# Patient Record
Sex: Male | Born: 1960 | Race: White | Hispanic: No | Marital: Married | Smoking: Former smoker
Health system: Southern US, Community
[De-identification: ages and names within clinical notes are randomized; demographics above are authoritative.]

## PROBLEM LIST (undated history)

## (undated) DIAGNOSIS — M1990 Unspecified osteoarthritis, unspecified site: Secondary | ICD-10-CM

## (undated) DIAGNOSIS — M4802 Spinal stenosis, cervical region: Secondary | ICD-10-CM

## (undated) DIAGNOSIS — I219 Acute myocardial infarction, unspecified: Secondary | ICD-10-CM

## (undated) DIAGNOSIS — M722 Plantar fascial fibromatosis: Secondary | ICD-10-CM

## (undated) DIAGNOSIS — E039 Hypothyroidism, unspecified: Secondary | ICD-10-CM

## (undated) DIAGNOSIS — D332 Benign neoplasm of brain, unspecified: Secondary | ICD-10-CM

## (undated) DIAGNOSIS — F329 Major depressive disorder, single episode, unspecified: Secondary | ICD-10-CM

## (undated) DIAGNOSIS — T4145XA Adverse effect of unspecified anesthetic, initial encounter: Secondary | ICD-10-CM

## (undated) DIAGNOSIS — F32A Depression, unspecified: Secondary | ICD-10-CM

## (undated) DIAGNOSIS — I454 Nonspecific intraventricular block: Secondary | ICD-10-CM

## (undated) DIAGNOSIS — E785 Hyperlipidemia, unspecified: Secondary | ICD-10-CM

## (undated) DIAGNOSIS — I259 Chronic ischemic heart disease, unspecified: Secondary | ICD-10-CM

## (undated) DIAGNOSIS — J302 Other seasonal allergic rhinitis: Secondary | ICD-10-CM

## (undated) DIAGNOSIS — I10 Essential (primary) hypertension: Secondary | ICD-10-CM

## (undated) DIAGNOSIS — T8859XA Other complications of anesthesia, initial encounter: Secondary | ICD-10-CM

## (undated) DIAGNOSIS — K5909 Other constipation: Secondary | ICD-10-CM

## (undated) DIAGNOSIS — M719 Bursopathy, unspecified: Secondary | ICD-10-CM

## (undated) DIAGNOSIS — Z87442 Personal history of urinary calculi: Secondary | ICD-10-CM

## (undated) HISTORY — PX: OTHER SURGICAL HISTORY: SHX169

## (undated) HISTORY — PX: BRAIN SURGERY: SHX531

---

## 2009-09-15 HISTORY — PX: CARDIAC CATHETERIZATION: SHX172

## 2009-09-15 HISTORY — PX: OTHER SURGICAL HISTORY: SHX169

## 2014-05-16 ENCOUNTER — Other Ambulatory Visit: Payer: Self-pay | Admitting: Orthopedic Surgery

## 2014-05-16 NOTE — Progress Notes (Signed)
Preoperative surgical orders have been place into the Epic hospital system for Ermelinda Das on 05/16/2014, 10:48 AM  by Mickel Crow for surgery on 06/26/2014.  Preop Total Hip - Anterior Approach orders including Experel Injecion, IV Tylenol, and IV Decadron as long as there are no contraindications to the above medications. Arlee Muslim, PA-C

## 2014-06-13 ENCOUNTER — Other Ambulatory Visit (HOSPITAL_COMMUNITY): Payer: Self-pay | Admitting: Orthopedic Surgery

## 2014-06-13 ENCOUNTER — Encounter (HOSPITAL_COMMUNITY): Payer: Self-pay

## 2014-06-13 NOTE — Progress Notes (Signed)
lov dr [facility] cardiology 10-16-13 on chart Medical clearance note dr Iona Hansen on chart for 06-26-14 surgery ekg 10-16-13 dr Barnabas Lister cardiology on chart Nuclear stress test 11-16-13 dr Sylvie Farrier on chart

## 2014-06-13 NOTE — Patient Instructions (Signed)
Jimmy Kelley  06/13/2014   Your procedure is scheduled on: Wednesday September 16th, 2015  Report to [facility] Main Entrance and follow signs to  [place] at 1100 AM.  Call this number if you have problems the morning of surgery [phone]   Remember:  Do not eat food :After Midnight.   clear liquids midnight unitl 700 am day of surgery, nothing by mouth after 700 am day of surgery.   Take these medicines the morning of surgery with A SIP OF WATER:                                You may not have any metal on your body including hair pins and piercings  Do not wear jewelry, make-up, lotions, powders, or deodorant.   Men may shave face and neck.  Do not bring valuables to the hospital. [place].  Contacts, dentures or bridgework may not be worn into surgery.  Leave suitcase in the car. After surgery it may be brought to your room.  For patients admitted to the hospital, checkout time is 11:00 AM the day of discharge.   Patients discharged the day of surgery will not be allowed to drive home.  Name and phone number of your driver:  Special Instructions: N/A ________________________________________________________________________  [facility] - Preparing for Surgery Before surgery, you can play an important role.  Because skin is not sterile, your skin needs to be as free of germs as possible.  You can reduce the number of germs on your skin by washing with CHG (chlorahexidine gluconate) soap before surgery.  CHG is an antiseptic cleaner which kills germs and bonds with the skin to continue killing germs even after washing. Please DO NOT use if you have an allergy to CHG or antibacterial soaps.  If your skin becomes reddened/irritated stop using the CHG and inform your nurse when you arrive at Short Stay. Do not shave (including legs and underarms) for at least 48 hours prior to the first CHG shower.  You may shave your  face/neck. Please follow these instructions carefully:  1.  Shower with CHG Soap the night before surgery and the  morning of Surgery.  2.  If you choose to wash your hair, wash your hair first as usual with your  normal  shampoo.  3.  After you shampoo, rinse your hair and body thoroughly to remove the  shampoo.                           4.  Use CHG as you would any other liquid soap.  You can apply chg directly  to the skin and wash                       Gently with a scrungie or clean washcloth.  5.  Apply the CHG Soap to your body ONLY FROM THE NECK DOWN.   Do not use on face/ open                           Wound or open sores. Avoid contact with eyes, ears mouth and genitals (private parts).                       Wash face,  Genitals (  private parts) with your normal soap.             6.  Wash thoroughly, paying special attention to the area where your surgery  will be performed.  7.  Thoroughly rinse your body with warm water from the neck down.  8.  DO NOT shower/wash with your normal soap after using and rinsing off  the CHG Soap.                9.  Pat yourself dry with a clean towel.            10.  Wear clean pajamas.            11.  Place clean sheets on your bed the night of your first shower and do not  sleep with pets. Day of Surgery : Do not apply any lotions/deodorants the morning of surgery.  Please wear clean clothes to the hospital/surgery center.  FAILURE TO FOLLOW THESE INSTRUCTIONS MAY RESULT IN THE CANCELLATION OF YOUR SURGERY PATIENT SIGNATURE_________________________________  NURSE SIGNATURE__________________________________  ________________________________________________________________________    CLEAR LIQUID DIET   Foods Allowed                                                                     Foods Excluded  Coffee and tea, regular and decaf                             liquids that you cannot  Plain Jell-O in any flavor                                              see through such as: Fruit ices (not with fruit pulp)                                     milk, soups, orange juice  Iced Popsicles                                    All solid food Carbonated beverages, regular and diet                                    Cranberry, grape and apple juices Sports drinks like Gatorade Lightly seasoned clear broth or consume(fat free) Sugar, honey syrup  Sample Menu Breakfast                                Lunch                                     Supper Cranberry juice                    Beef broth  Chicken broth Jell-O                                     Grape juice                           Apple juice Coffee or tea                        Jell-O                                      Popsicle                                                Coffee or tea                        Coffee or tea  _____________________________________________________________________    Incentive Spirometer  An incentive spirometer is a tool that can help keep your lungs clear and active. This tool measures how well you are filling your lungs with each breath. Taking long deep breaths may help reverse or decrease the chance of developing breathing (pulmonary) problems (especially infection) following:  A long period of time when you are unable to move or be active. BEFORE THE PROCEDURE   If the spirometer includes an indicator to show your best effort, your nurse or respiratory therapist will set it to a desired goal.  If possible, sit up straight or lean slightly forward. Try not to slouch.  Hold the incentive spirometer in an upright position. INSTRUCTIONS FOR USE  1. Sit on the edge of your bed if possible, or sit up as far as you can in bed or on a chair. 2. Hold the incentive spirometer in an upright position. 3. Breathe out normally. 4. Place the mouthpiece in your mouth and seal your lips tightly around it. 5. Breathe in slowly  and as deeply as possible, raising the piston or the ball toward the top of the column. 6. Hold your breath for 3-5 seconds or for as long as possible. Allow the piston or ball to fall to the bottom of the column. 7. Remove the mouthpiece from your mouth and breathe out normally. 8. Rest for a few seconds and repeat Steps 1 through 7 at least 10 times every 1-2 hours when you are awake. Take your time and take a few normal breaths between deep breaths. 9. The spirometer may include an indicator to show your best effort. Use the indicator as a goal to work toward during each repetition. 10. After each set of 10 deep breaths, practice coughing to be sure your lungs are clear. If you have an incision (the cut made at the time of surgery), support your incision when coughing by placing a pillow or rolled up towels firmly against it. Once you are able to get out of bed, walk around indoors and cough well. You may stop using the incentive spirometer when instructed by your caregiver.  RISKS AND COMPLICATIONS  Take your time so you do not get dizzy or light-headed.  If you are in pain, you  may need to take or ask for pain medication before doing incentive spirometry. It is harder to take a deep breath if you are having pain. AFTER USE  Rest and breathe slowly and easily.  It can be helpful to keep track of a log of your progress. Your caregiver can provide you with a simple table to help with this. If you are using the spirometer at home, follow these instructions: [place] IF:   You are having difficultly using the spirometer.  You have trouble using the spirometer as often as instructed.  Your pain medication is not giving enough relief while using the spirometer.  You develop fever of 100.5 F (38.1 C) or higher. SEEK IMMEDIATE MEDICAL CARE IF:   You cough up bloody sputum that had not been present before.  You develop fever of 102 F (38.9 C) or greater.  You develop  worsening pain at or near the incision site. MAKE SURE YOU:   Understand these instructions.  Will watch your condition.  Will get help right away if you are not doing well or get worse. Document Released: 02/07/2007 Document Revised: 12/20/2011 Document Reviewed: 04/10/2007 ExitCare Patient Information 2014 ExitCare, [place].   ________________________________________________________________________  WHAT IS A BLOOD TRANSFUSION? Blood Transfusion Information  A transfusion is the replacement of blood or some of its parts. Blood is made up of multiple cells which provide different functions.  Red blood cells carry oxygen and are used for blood loss replacement.  White blood cells fight against infection.  Platelets control bleeding.  Plasma helps clot blood.  Other blood products are available for specialized needs, such as hemophilia or other clotting disorders. BEFORE THE TRANSFUSION  Who gives blood for transfusions?   Healthy volunteers who are fully evaluated to make sure their blood is safe. This is blood bank blood. Transfusion therapy is the safest it has ever been in the practice of medicine. Before blood is taken from a donor, a complete history is taken to make sure that person has no history of diseases nor engages in risky social behavior (examples are intravenous drug use or sexual activity with multiple partners). The donor's travel history is screened to minimize risk of transmitting infections, such as malaria. The donated blood is tested for signs of infectious diseases, such as HIV and hepatitis. The blood is then tested to be sure it is compatible with you in order to minimize the chance of a transfusion reaction. If you or a relative donates blood, this is often done in anticipation of surgery and is not appropriate for emergency situations. It takes many days to process the donated blood. RISKS AND COMPLICATIONS Although transfusion therapy is very safe and saves  many lives, the main dangers of transfusion include:   Getting an infectious disease.  Developing a transfusion reaction. This is an allergic reaction to something in the blood you were given. Every precaution is taken to prevent this. The decision to have a blood transfusion has been considered carefully by your caregiver before blood is given. Blood is not given unless the benefits outweigh the risks. AFTER THE TRANSFUSION  Right after receiving a blood transfusion, you will usually feel much better and more energetic. This is especially true if your red blood cells have gotten low (anemic). The transfusion raises the level of the red blood cells which carry oxygen, and this usually causes an energy increase.  The nurse administering the transfusion will monitor you carefully for complications. HOME CARE INSTRUCTIONS  No  special instructions are needed after a transfusion. You may find your energy is better. Speak with your caregiver about any limitations on activity for underlying diseases you may have. SEEK MEDICAL CARE IF:   Your condition is not improving after your transfusion.  You develop redness or irritation at the intravenous (IV) site. SEEK IMMEDIATE MEDICAL CARE IF:  Any of the following symptoms occur over the next 12 hours:  Shaking chills.  You have a temperature by mouth above 102 F (38.9 C), not controlled by medicine.  Chest, back, or muscle pain.  People around you feel you are not acting correctly or are confused.  Shortness of breath or difficulty breathing.  Dizziness and fainting.  You get a rash or develop hives.  You have a decrease in urine output.  Your urine turns a dark color or changes to pink, red, or brown. Any of the following symptoms occur over the next 10 days:  You have a temperature by mouth above 102 F (38.9 C), not controlled by medicine.  Shortness of breath.  Weakness after normal activity.  The white part of the eye turns  yellow (jaundice).  You have a decrease in the amount of urine or are urinating less often.  Your urine turns a dark color or changes to pink, red, or brown. Document Released: 09/24/2000 Document Revised: 12/20/2011 Document Reviewed: 05/13/2008 [facility] Patient Information 2014 [place], [place].  _______________________________________________________________________

## 2014-06-14 ENCOUNTER — Inpatient Hospital Stay (HOSPITAL_COMMUNITY)
Admission: RE | Admit: 2014-06-14 | Discharge: 2014-06-14 | Disposition: A | Discharge status: Home / Self Care | Payer: PRIVATE HEALTH INSURANCE | Source: Ambulatory Visit

## 2014-06-14 HISTORY — DX: Nonspecific intraventricular block: I45.4

## 2014-06-14 HISTORY — DX: Personal history of urinary calculi: Z87.442

## 2014-06-14 HISTORY — DX: Essential (primary) hypertension: I10

## 2014-06-14 HISTORY — DX: Depression, unspecified: F32.A

## 2014-06-14 HISTORY — DX: Spinal stenosis, cervical region: M48.02

## 2014-06-14 HISTORY — DX: Major depressive disorder, single episode, unspecified: F32.9

## 2014-06-14 HISTORY — DX: Unspecified osteoarthritis, unspecified site: M19.90

## 2014-06-14 HISTORY — DX: Hypothyroidism, unspecified: E03.9

## 2014-06-14 HISTORY — DX: Chronic ischemic heart disease, unspecified: I25.9

## 2014-06-14 HISTORY — DX: Bursopathy, unspecified: M71.9

## 2014-06-14 HISTORY — DX: Plantar fascial fibromatosis: M72.2

## 2014-06-14 HISTORY — DX: Other constipation: K59.09

## 2014-06-14 HISTORY — DX: Hyperlipidemia, unspecified: E78.5

## 2014-06-18 ENCOUNTER — Encounter (HOSPITAL_COMMUNITY): Payer: Self-pay

## 2014-06-18 ENCOUNTER — Ambulatory Visit (HOSPITAL_COMMUNITY)
Admission: RE | Admit: 2014-06-18 | Discharge: 2014-06-18 | Disposition: A | Discharge status: Home / Self Care | Payer: PRIVATE HEALTH INSURANCE | Source: Ambulatory Visit | Attending: Anesthesiology | Admitting: Anesthesiology

## 2014-06-18 ENCOUNTER — Encounter (HOSPITAL_COMMUNITY): Payer: Self-pay | Admitting: Pharmacy Technician

## 2014-06-18 ENCOUNTER — Other Ambulatory Visit: Payer: Self-pay | Admitting: Orthopedic Surgery

## 2014-06-18 ENCOUNTER — Encounter (HOSPITAL_COMMUNITY)
Admission: RE | Admit: 2014-06-18 | Discharge: 2014-06-18 | Disposition: A | Discharge status: Home / Self Care | Payer: PRIVATE HEALTH INSURANCE | Source: Ambulatory Visit | Attending: Orthopedic Surgery | Admitting: Orthopedic Surgery

## 2014-06-18 DIAGNOSIS — Z96649 Presence of unspecified artificial hip joint: Secondary | ICD-10-CM | POA: Insufficient documentation

## 2014-06-18 DIAGNOSIS — M87059 Idiopathic aseptic necrosis of unspecified femur: Secondary | ICD-10-CM | POA: Insufficient documentation

## 2014-06-18 DIAGNOSIS — M169 Osteoarthritis of hip, unspecified: Secondary | ICD-10-CM | POA: Diagnosis not present

## 2014-06-18 DIAGNOSIS — Z01818 Encounter for other preprocedural examination: Secondary | ICD-10-CM | POA: Insufficient documentation

## 2014-06-18 DIAGNOSIS — Z87891 Personal history of nicotine dependence: Secondary | ICD-10-CM | POA: Insufficient documentation

## 2014-06-18 DIAGNOSIS — M1610 Unilateral primary osteoarthritis, unspecified hip: Secondary | ICD-10-CM | POA: Insufficient documentation

## 2014-06-18 HISTORY — DX: Adverse effect of unspecified anesthetic, initial encounter: T41.45XA

## 2014-06-18 HISTORY — DX: Other complications of anesthesia, initial encounter: T88.59XA

## 2014-06-18 HISTORY — DX: Benign neoplasm of brain, unspecified: D33.2

## 2014-06-18 HISTORY — DX: Other seasonal allergic rhinitis: J30.2

## 2014-06-18 HISTORY — DX: Acute myocardial infarction, unspecified: I21.9

## 2014-06-18 LAB — URINE MICROSCOPIC-ADD ON

## 2014-06-18 LAB — PROTIME-INR
INR: 1.02 (ref 0.00–1.49)
Prothrombin Time: 13.4 seconds (ref 11.6–15.2)

## 2014-06-18 LAB — URINALYSIS, ROUTINE W REFLEX MICROSCOPIC
Glucose, UA: NEGATIVE mg/dL
Ketones, ur: NEGATIVE mg/dL
LEUKOCYTES UA: NEGATIVE
Nitrite: NEGATIVE
Protein, ur: NEGATIVE mg/dL
SPECIFIC GRAVITY, URINE: 1.031 — AB (ref 1.005–1.030)
UROBILINOGEN UA: 0.2 mg/dL (ref 0.0–1.0)
pH: 5.5 (ref 5.0–8.0)

## 2014-06-18 LAB — COMPREHENSIVE METABOLIC PANEL
ALBUMIN: 4.1 g/dL (ref 3.5–5.2)
ALK PHOS: 75 U/L (ref 39–117)
ALT: 40 U/L (ref 0–53)
AST: 39 U/L — ABNORMAL HIGH (ref 0–37)
Anion gap: 12 (ref 5–15)
BILIRUBIN TOTAL: 1 mg/dL (ref 0.3–1.2)
BUN: 19 mg/dL (ref 6–23)
CHLORIDE: 97 meq/L (ref 96–112)
CO2: 29 mEq/L (ref 19–32)
Calcium: 9.9 mg/dL (ref 8.4–10.5)
Creatinine, Ser: 1.41 mg/dL — ABNORMAL HIGH (ref 0.50–1.35)
GFR calc Af Amer: 64 mL/min — ABNORMAL LOW (ref >90)
GFR calc non Af Amer: 55 mL/min — ABNORMAL LOW (ref >90)
GLUCOSE: 97 mg/dL (ref 70–99)
POTASSIUM: 4 meq/L (ref 3.7–5.3)
Sodium: 138 mEq/L (ref 137–147)
Total Protein: 7.9 g/dL (ref 6.0–8.3)

## 2014-06-18 LAB — CBC
HCT: 40.4 % (ref 39.0–52.0)
HEMOGLOBIN: 14 g/dL (ref 13.0–17.0)
MCH: 30.8 pg (ref 26.0–34.0)
MCHC: 34.7 g/dL (ref 30.0–36.0)
MCV: 89 fL (ref 78.0–100.0)
Platelets: 280 10*3/uL (ref 150–400)
RBC: 4.54 MIL/uL (ref 4.22–5.81)
RDW: 12.7 % (ref 11.5–15.5)
WBC: 10.1 10*3/uL (ref 4.0–10.5)

## 2014-06-18 LAB — SURGICAL PCR SCREEN
MRSA, PCR: NEGATIVE
STAPHYLOCOCCUS AUREUS: NEGATIVE

## 2014-06-18 LAB — ABO/RH: ABO/RH(D): A POS

## 2014-06-18 LAB — APTT: APTT: 30 s (ref 24–37)

## 2014-06-18 NOTE — Patient Instructions (Signed)
[place]  06/18/2014   Your procedure is scheduled on: 06/26/2014  Report to [facility] Main Entrance and follow signs to  [place] at 10:00 AM.  Call this number if you have problems the morning of surgery [phone]   Remember:  Do not eat food or drink liquids :After Midnight.     Take these medicines the morning of surgery with A SIP OF WATER: Celexa,Levothyroxine and pain med as needed.                               You may not have any metal on your body including hair pins and piercings  Do not wear jewelry, lotions, powders, or deodorant.   Men may shave face and neck.  Do not bring valuables to the hospital. [place].  Contacts, dentures or bridgework may not be worn into surgery.  Leave suitcase in the car. After surgery it may be brought to your room.  For patients admitted to the hospital, checkout time is 11:00 AM the day of discharge.   Name and phone number of your driver:  Special Instructions: N/A ________________________________________________________________________  [facility] - Preparing for Surgery Before surgery, you can play an important role.  Because skin is not sterile, your skin needs to be as free of germs as possible.  You can reduce the number of germs on your skin by washing with CHG (chlorahexidine gluconate) soap before surgery.  CHG is an antiseptic cleaner which kills germs and bonds with the skin to continue killing germs even after washing. Please DO NOT use if you have an allergy to CHG or antibacterial soaps.  If your skin becomes reddened/irritated stop using the CHG and inform your nurse when you arrive at Short Stay. Do not shave (including legs and underarms) for at least 48 hours prior to the first CHG shower.  You may shave your face/neck. Please follow these instructions carefully:  1.  Shower with CHG Soap the night before surgery and the  morning of Surgery.  2.  If you  choose to wash your hair, wash your hair first as usual with your  normal  shampoo.  3.  After you shampoo, rinse your hair and body thoroughly to remove the  shampoo.                           4.  Use CHG as you would any other liquid soap.  You can apply chg directly  to the skin and wash                       Gently with a scrungie or clean washcloth.  5.  Apply the CHG Soap to your body ONLY FROM THE NECK DOWN.   Do not use on face/ open                           Wound or open sores. Avoid contact with eyes, ears mouth and genitals (private parts).                       Wash face,  Genitals (private parts) with your normal soap.             6.  Wash thoroughly, paying special attention to the area  where your surgery  will be performed.  7.  Thoroughly rinse your body with warm water from the neck down.  8.  DO NOT shower/wash with your normal soap after using and rinsing off  the CHG Soap.                9.  Pat yourself dry with a clean towel.            10.  Wear clean pajamas.            11.  Place clean sheets on your bed the night of your first shower and do not  sleep with pets. Day of Surgery : Do not apply any lotions/deodorants the morning of surgery.  Please wear clean clothes to the hospital/surgery center.  FAILURE TO FOLLOW THESE INSTRUCTIONS MAY RESULT IN THE CANCELLATION OF YOUR SURGERY PATIENT SIGNATURE_________________________________  NURSE SIGNATURE__________________________________  ________________________________________________________________________   Jimmy Kelley  An incentive spirometer is a tool that can help keep your lungs clear and active. This tool measures how well you are filling your lungs with each breath. Taking long deep breaths may help reverse or decrease the chance of developing breathing (pulmonary) problems (especially infection) following:  A long period of time when you are unable to move or be active. BEFORE THE PROCEDURE   If  the spirometer includes an indicator to show your best effort, your nurse or respiratory therapist will set it to a desired goal.  If possible, sit up straight or lean slightly forward. Try not to slouch.  Hold the incentive spirometer in an upright position. INSTRUCTIONS FOR USE  1. Sit on the edge of your bed if possible, or sit up as far as you can in bed or on a chair. 2. Hold the incentive spirometer in an upright position. 3. Breathe out normally. 4. Place the mouthpiece in your mouth and seal your lips tightly around it. 5. Breathe in slowly and as deeply as possible, raising the piston or the ball toward the top of the column. 6. Hold your breath for 3-5 seconds or for as long as possible. Allow the piston or ball to fall to the bottom of the column. 7. Remove the mouthpiece from your mouth and breathe out normally. 8. Rest for a few seconds and repeat Steps 1 through 7 at least 10 times every 1-2 hours when you are awake. Take your time and take a few normal breaths between deep breaths. 9. The spirometer may include an indicator to show your best effort. Use the indicator as a goal to work toward during each repetition. 10. After each set of 10 deep breaths, practice coughing to be sure your lungs are clear. If you have an incision (the cut made at the time of surgery), support your incision when coughing by placing a pillow or rolled up towels firmly against it. Once you are able to get out of bed, walk around indoors and cough well. You may stop using the incentive spirometer when instructed by your caregiver.  RISKS AND COMPLICATIONS  Take your time so you do not get dizzy or light-headed.  If you are in pain, you may need to take or ask for pain medication before doing incentive spirometry. It is harder to take a deep breath if you are having pain. AFTER USE  Rest and breathe slowly and easily.  It can be helpful to keep track of a log of your progress. Your caregiver can  provide you with a simple  table to help with this. If you are using the spirometer at home, follow these instructions: [place] IF:   You are having difficultly using the spirometer.  You have trouble using the spirometer as often as instructed.  Your pain medication is not giving enough relief while using the spirometer.  You develop fever of 100.5 F (38.1 C) or higher. SEEK IMMEDIATE MEDICAL CARE IF:   You cough up bloody sputum that had not been present before.  You develop fever of 102 F (38.9 C) or greater.  You develop worsening pain at or near the incision site. MAKE SURE YOU:   Understand these instructions.  Will watch your condition.  Will get help right away if you are not doing well or get worse. Document Released: 02/07/2007 Document Revised: 12/20/2011 Document Reviewed: 04/10/2007 ExitCare Patient Information 2014 ExitCare, [place].   ________________________________________________________________________  WHAT IS A BLOOD TRANSFUSION? Blood Transfusion Information  A transfusion is the replacement of blood or some of its parts. Blood is made up of multiple cells which provide different functions.  Red blood cells carry oxygen and are used for blood loss replacement.  White blood cells fight against infection.  Platelets control bleeding.  Plasma helps clot blood.  Other blood products are available for specialized needs, such as hemophilia or other clotting disorders. BEFORE THE TRANSFUSION  Who gives blood for transfusions?   Healthy volunteers who are fully evaluated to make sure their blood is safe. This is blood bank blood. Transfusion therapy is the safest it has ever been in the practice of medicine. Before blood is taken from a donor, a complete history is taken to make sure that person has no history of diseases nor engages in risky social behavior (examples are intravenous drug use or sexual activity with multiple partners). The  donor's travel history is screened to minimize risk of transmitting infections, such as malaria. The donated blood is tested for signs of infectious diseases, such as HIV and hepatitis. The blood is then tested to be sure it is compatible with you in order to minimize the chance of a transfusion reaction. If you or a relative donates blood, this is often done in anticipation of surgery and is not appropriate for emergency situations. It takes many days to process the donated blood. RISKS AND COMPLICATIONS Although transfusion therapy is very safe and saves many lives, the main dangers of transfusion include:   Getting an infectious disease.  Developing a transfusion reaction. This is an allergic reaction to something in the blood you were given. Every precaution is taken to prevent this. The decision to have a blood transfusion has been considered carefully by your caregiver before blood is given. Blood is not given unless the benefits outweigh the risks. AFTER THE TRANSFUSION  Right after receiving a blood transfusion, you will usually feel much better and more energetic. This is especially true if your red blood cells have gotten low (anemic). The transfusion raises the level of the red blood cells which carry oxygen, and this usually causes an energy increase.  The nurse administering the transfusion will monitor you carefully for complications. HOME CARE INSTRUCTIONS  No special instructions are needed after a transfusion. You may find your energy is better. Speak with your caregiver about any limitations on activity for underlying diseases you may have. SEEK MEDICAL CARE IF:   Your condition is not improving after your transfusion.  You develop redness or irritation at the intravenous (IV) site. [place]  IF:  Any of the following symptoms occur over the next 12 hours:  Shaking chills.  You have a temperature by mouth above 102 F (38.9 C), not controlled by  medicine.  Chest, back, or muscle pain.  People around you feel you are not acting correctly or are confused.  Shortness of breath or difficulty breathing.  Dizziness and fainting.  You get a rash or develop hives.  You have a decrease in urine output.  Your urine turns a dark color or changes to pink, red, or brown. Any of the following symptoms occur over the next 10 days:  You have a temperature by mouth above 102 F (38.9 C), not controlled by medicine.  Shortness of breath.  Weakness after normal activity.  The white part of the eye turns yellow (jaundice).  You have a decrease in the amount of urine or are urinating less often.  Your urine turns a dark color or changes to pink, red, or brown. Document Released: 09/24/2000 Document Revised: 12/20/2011 Document Reviewed: 05/13/2008 [facility] Patient Information 2014 [place], [place].  _______________________________________________________________________

## 2014-06-18 NOTE — Progress Notes (Signed)
Stop bang tool results per PAT visit 06/18/2014 sent to Dr Vista Lawman per [facility]

## 2014-06-18 NOTE — Pre-Procedure Instructions (Addendum)
EKG 10/2013;stress test 11/2013;LOV note [facility],clearance note 10/16/2013 Dr McGukin;clearance note Dr Vista Lawman 03/19/2014 reports with chart;chest x ray done per PAT visit 06/18/2014

## 2014-06-18 NOTE — Pre-Procedure Instructions (Signed)
Labs visible in epic note urinalysis

## 2014-06-19 ENCOUNTER — Other Ambulatory Visit: Payer: Self-pay | Admitting: Surgical

## 2014-06-19 MED ORDER — TRANEXAMIC ACID 100 MG/ML IV SOLN: 2000.0000 mg | Freq: Once | INTRAVENOUS | Status: AC

## 2014-06-19 NOTE — H&P (Signed)
TOTAL HIP ADMISSION H&P  Patient is admitted for left total hip arthroplasty.  Subjective:  Chief Complaint: left hip pain  HPI: Jimmy Kelley, 53 y.o. male, has a history of pain and functional disability in the left hip(s) due to arthritis and AVN and patient has failed non-surgical conservative treatments for greater than 12 weeks to include NSAID's and/or analgesics, corticosteriod injections, flexibility and strengthening excercises, weight reduction as appropriate and activity modification.  Onset of symptoms was gradual starting 2 years ago with gradually worsening course since that time.The patient noted no past surgery on the left hip(s).  Patient currently rates pain in the left hip at 7 out of 10 with activity. Patient has night pain, worsening of pain with activity and weight bearing, pain that interfers with activities of daily living, pain with passive range of motion and crepitus. Patient has evidence of subchondral sclerosis, periarticular osteophytes and joint space narrowing by imaging studies. This condition presents safety issues increasing the risk of falls. This patient has had avascular necrosis of the hip.  There is no current active infection.   Past Medical History  Diagnosis Date  . Hypertension   . History of kidney stones   . Cervical spinal stenosis   . Bursitis     heel  . Chronic constipation   . Hypothyroidism   . Hyperlipidemia   . Intraventricular conduction defect   . Depression   . Chronic ischemic heart disease   . Arthritis     oa  . Plantar fasciitis   . Complication of anesthesia     pt states he wakes up slowly   . Myocardial infarction     MI 09/15/2009 stent placement   . Seasonal allergies   . Brain tumor (benign)     craniotomy    Past Surgical History  Procedure Laterality Date  . Stent to heart  09-15-2009    to LAD  . Cardiac catheterization  09-15-2009    times one stent  . Brain surgery      age 50  . Ganglion cyst left wrist     . Thumb surgery       local left thumb      Current outpatient prescriptions: acetaminophen (TYLENOL) 325 MG tablet, Take 650 mg by mouth every 6 (six) hours as needed for moderate pain or headache., Disp: , Rfl: ;   aspirin EC 81 MG tablet, Take 81 mg by mouth every morning., Disp: , Rfl: ;   atorvastatin (LIPITOR) 80 MG tablet, Take 80 mg by mouth at bedtime., Disp: , Rfl: ;   citalopram (CELEXA) 40 MG tablet, Take 40 mg by mouth every morning. Pt states takes at night, Disp: , Rfl:  clopidogrel (PLAVIX) 75 MG tablet, Take 75 mg by mouth at bedtime. , Disp: , Rfl: ;   docusate sodium (COLACE) 100 MG capsule, Take 200 mg by mouth daily. Takes as needed, Disp: , Rfl: ;   latanoprost (XALATAN) 0.005 % ophthalmic solution, Place 1 drop into both eyes at bedtime., Disp: , Rfl: ;  levocetirizine (XYZAL) 5 MG tablet, Take 5 mg by mouth every evening. Has not taken in past 3 months, Disp: , Rfl:  levothyroxine (SYNTHROID, LEVOTHROID) 200 MCG tablet, Take 200 mcg by mouth daily before breakfast., Disp: , Rfl: ;   Multiple Vitamin (MULTIVITAMIN WITH MINERALS) TABS tablet, Take 1 tablet by mouth every evening., Disp: , Rfl: ;   traMADol (ULTRAM) 50 MG tablet, Take 50 mg by mouth 4 (four) times  daily as needed for moderate pain., Disp: , Rfl:   No Known Allergies  History  Substance Use Topics  . Smoking status: Former Smoker    Quit date: 09/15/2009  . Smokeless tobacco: Never Used  . Alcohol Use: No    Family History Cancer. Father. Cerebrovascular Accident. Father. Depression. Mother, Sister. Diabetes Mellitus. Paternal Grandmother. Hypertension. Father, Mother.  Review of Systems  Constitutional: Negative.   HENT: Negative.   Eyes: Negative.   Respiratory: Negative.   Cardiovascular: Negative.   Gastrointestinal: Positive for constipation. Negative for heartburn, nausea, vomiting, abdominal pain, diarrhea, blood in stool and melena.  Genitourinary: Negative.    Musculoskeletal: Positive for back pain and joint pain. Negative for falls, myalgias and neck pain.       Bilateral hip pain  Skin: Negative.   Neurological: Negative.   Endo/Heme/Allergies: Negative.   Psychiatric/Behavioral: Negative.     Objective:  Physical Exam  Constitutional: He is oriented to person, place, and time. He appears well-developed. No distress.  Obese  HENT:  Head: Normocephalic and atraumatic.  Right Ear: External ear normal.  Left Ear: External ear normal.  Nose: Nose normal.  Mouth/Throat: Oropharynx is clear and moist.  Eyes: Conjunctivae and EOM are normal.  Neck: Normal range of motion. Neck supple.  Cardiovascular: Normal rate, regular rhythm, normal heart sounds and intact distal pulses.   No murmur heard. Respiratory: Effort normal and breath sounds normal. No respiratory distress. He has no wheezes.  GI: Soft. Bowel sounds are normal. He exhibits no distension. There is no tenderness.  Musculoskeletal:       Right hip: Normal.       Left hip: He exhibits decreased range of motion and decreased strength.       Right knee: Normal.       Left knee: Normal.       Right lower leg: He exhibits no tenderness and no swelling.       Left lower leg: He exhibits no tenderness and no swelling.  Evaluation of his left hip flexion to 110, rotation in 20, out 30, and abduction 40 with some pain on range of motion. Right hip flexion to 120, rotation in 30, out 40, and abduction 40 without discomfort. Knee exam is normal.  Neurological: He is alert and oriented to person, place, and time. He has normal strength and normal reflexes. No sensory deficit.  Skin: No rash noted. He is not diaphoretic. No erythema.  Psychiatric: He has a normal mood and affect. His behavior is normal.    Vital signs in last 24 hours: Temp:  [98.4 F (36.9 C)] 98.4 F (36.9 C) (09/08 1428) Pulse Rate:  [86] 86 (09/08 1428) Resp:  [18] 18 (09/08 1428) BP: (107)/(72) 107/72 mmHg  (09/08 1428) SpO2:  [98 %] 98 % (09/08 1428) Weight:  [116.756 kg (257 lb 6.4 oz)] 116.756 kg (257 lb 6.4 oz) (09/08 1428)    Imaging Review Plain radiographs demonstrate severe degenerative joint disease of the left hip(s). The bone quality appears to be good for age and reported activity level.  Assessment/Plan:  End stage arthritis, left hip(s)  The patient history, physical examination, clinical judgement of the provider and imaging studies are consistent with end stage degenerative joint disease of the left hip(s) and total hip arthroplasty is deemed medically necessary. The treatment options including medical management, injection therapy, arthroscopy and arthroplasty were discussed at length. The risks and benefits of total hip arthroplasty were presented and reviewed. The risks due  to aseptic loosening, infection, stiffness, dislocation/subluxation,  thromboembolic complications and other imponderables were discussed.  The patient acknowledged the explanation, agreed to proceed with the plan and consent was signed. Patient is being admitted for inpatient treatment for surgery, pain control, PT, OT, prophylactic antibiotics, VTE prophylaxis, progressive ambulation and ADL's and discharge planning.The patient is planning to be discharged home with home health services   Topical TXA  Cardio: Dr. Beatrix Fetters PCP: Dr. Katrine Coho, PA-C

## 2014-06-26 ENCOUNTER — Encounter (HOSPITAL_COMMUNITY): Payer: Self-pay | Admitting: *Deleted

## 2014-06-26 ENCOUNTER — Encounter (HOSPITAL_COMMUNITY)
Admission: RE | Disposition: A | Discharge status: Home Health | Payer: Self-pay | Source: Ambulatory Visit | Attending: Orthopedic Surgery

## 2014-06-26 ENCOUNTER — Inpatient Hospital Stay (HOSPITAL_COMMUNITY)
Admission: RE | Admit: 2014-06-26 | Discharge: 2014-06-27 | DRG: 470 | Disposition: A | Discharge status: Home Health | Payer: PRIVATE HEALTH INSURANCE | Source: Ambulatory Visit | Attending: Orthopedic Surgery | Admitting: Orthopedic Surgery

## 2014-06-26 ENCOUNTER — Inpatient Hospital Stay (HOSPITAL_COMMUNITY): Payer: PRIVATE HEALTH INSURANCE

## 2014-06-26 ENCOUNTER — Encounter (HOSPITAL_COMMUNITY): Payer: PRIVATE HEALTH INSURANCE | Admitting: Anesthesiology

## 2014-06-26 ENCOUNTER — Inpatient Hospital Stay (HOSPITAL_COMMUNITY): Payer: PRIVATE HEALTH INSURANCE | Admitting: Anesthesiology

## 2014-06-26 DIAGNOSIS — Z6832 Body mass index (BMI) 32.0-32.9, adult: Secondary | ICD-10-CM

## 2014-06-26 DIAGNOSIS — M25559 Pain in unspecified hip: Secondary | ICD-10-CM | POA: Diagnosis present

## 2014-06-26 DIAGNOSIS — M87059 Idiopathic aseptic necrosis of unspecified femur: Secondary | ICD-10-CM | POA: Diagnosis present

## 2014-06-26 DIAGNOSIS — I252 Old myocardial infarction: Secondary | ICD-10-CM

## 2014-06-26 DIAGNOSIS — Z79899 Other long term (current) drug therapy: Secondary | ICD-10-CM | POA: Diagnosis not present

## 2014-06-26 DIAGNOSIS — E785 Hyperlipidemia, unspecified: Secondary | ICD-10-CM | POA: Diagnosis present

## 2014-06-26 DIAGNOSIS — E039 Hypothyroidism, unspecified: Secondary | ICD-10-CM | POA: Diagnosis present

## 2014-06-26 DIAGNOSIS — Z7982 Long term (current) use of aspirin: Secondary | ICD-10-CM

## 2014-06-26 DIAGNOSIS — Z9861 Coronary angioplasty status: Secondary | ICD-10-CM | POA: Diagnosis not present

## 2014-06-26 DIAGNOSIS — Z87891 Personal history of nicotine dependence: Secondary | ICD-10-CM | POA: Diagnosis not present

## 2014-06-26 DIAGNOSIS — M1612 Unilateral primary osteoarthritis, left hip: Secondary | ICD-10-CM

## 2014-06-26 DIAGNOSIS — M169 Osteoarthritis of hip, unspecified: Secondary | ICD-10-CM | POA: Diagnosis present

## 2014-06-26 DIAGNOSIS — M1610 Unilateral primary osteoarthritis, unspecified hip: Secondary | ICD-10-CM | POA: Diagnosis present

## 2014-06-26 DIAGNOSIS — Z87442 Personal history of urinary calculi: Secondary | ICD-10-CM

## 2014-06-26 DIAGNOSIS — I10 Essential (primary) hypertension: Secondary | ICD-10-CM | POA: Diagnosis present

## 2014-06-26 HISTORY — PX: TOTAL HIP ARTHROPLASTY: SHX124

## 2014-06-26 LAB — TYPE AND SCREEN
ABO/RH(D): A POS
ANTIBODY SCREEN: NEGATIVE

## 2014-06-26 SURGERY — ARTHROPLASTY, HIP, TOTAL, ANTERIOR APPROACH
Anesthesia: General | Site: Hip | Laterality: Left

## 2014-06-26 MED ORDER — DEXTROSE-NACL 5-0.9 % IV SOLN
INTRAVENOUS | Status: DC
  Administered 2014-06-26: 100 mL/h via INTRAVENOUS

## 2014-06-26 MED ORDER — MIDAZOLAM HCL 5 MG/5ML IJ SOLN
INTRAMUSCULAR | Status: DC | PRN
  Administered 2014-06-26: 2 mg via INTRAVENOUS

## 2014-06-26 MED ORDER — BUPIVACAINE LIPOSOME 1.3 % IJ SUSP
INTRAMUSCULAR | Status: DC | PRN
  Administered 2014-06-26: 20 mL

## 2014-06-26 MED ORDER — KETOROLAC TROMETHAMINE 15 MG/ML IJ SOLN
7.5000 mg | Freq: Four times a day (QID) | INTRAMUSCULAR | Status: AC | PRN
  Administered 2014-06-26 (×2): 7.5 mg via INTRAVENOUS
  Filled 2014-06-26: qty 1

## 2014-06-26 MED ORDER — 0.9 % SODIUM CHLORIDE (POUR BTL) OPTIME
TOPICAL | Status: DC | PRN
  Administered 2014-06-26: 1000 mL

## 2014-06-26 MED ORDER — PROPOFOL 10 MG/ML IV BOLUS
INTRAVENOUS | Status: DC | PRN
Start: 2014-06-26 — End: 2014-06-26
  Administered 2014-06-26: 200 mg via INTRAVENOUS

## 2014-06-26 MED ORDER — ONDANSETRON HCL 4 MG/2ML IJ SOLN
4.0000 mg | Freq: Four times a day (QID) | INTRAMUSCULAR | Status: DC | PRN
Start: 2014-06-26 — End: 2014-06-27

## 2014-06-26 MED ORDER — METOCLOPRAMIDE HCL 5 MG/ML IJ SOLN: 5.0000 mg | Freq: Three times a day (TID) | INTRAMUSCULAR | Status: DC | PRN

## 2014-06-26 MED ORDER — NEOSTIGMINE METHYLSULFATE 10 MG/10ML IV SOLN
INTRAVENOUS | Status: DC | PRN
  Administered 2014-06-26: 3 mg via INTRAVENOUS

## 2014-06-26 MED ORDER — DEXAMETHASONE SODIUM PHOSPHATE 10 MG/ML IJ SOLN
10.0000 mg | Freq: Once | INTRAMUSCULAR | Status: AC
  Administered 2014-06-26: 10 mg via INTRAVENOUS

## 2014-06-26 MED ORDER — ACETAMINOPHEN 650 MG RE SUPP: 650.0000 mg | Freq: Four times a day (QID) | RECTAL | Status: DC | PRN

## 2014-06-26 MED ORDER — DOCUSATE SODIUM 100 MG PO CAPS
100.0000 mg | ORAL_CAPSULE | Freq: Two times a day (BID) | ORAL | Status: DC
  Administered 2014-06-26 – 2014-06-27 (×2): 100 mg via ORAL

## 2014-06-26 MED ORDER — MENTHOL 3 MG MT LOZG: 1.0000 | LOZENGE | OROMUCOSAL | Status: DC | PRN

## 2014-06-26 MED ORDER — DEXAMETHASONE 6 MG PO TABS
10.0000 mg | ORAL_TABLET | Freq: Every day | ORAL | Status: AC
  Administered 2014-06-27: 10 mg via ORAL
  Filled 2014-06-26: qty 1

## 2014-06-26 MED ORDER — OXYCODONE HCL 5 MG PO TABS
5.0000 mg | ORAL_TABLET | ORAL | Status: DC | PRN
  Administered 2014-06-26 – 2014-06-27 (×6): 10 mg via ORAL
  Filled 2014-06-26 (×6): qty 2

## 2014-06-26 MED ORDER — MORPHINE SULFATE 2 MG/ML IJ SOLN: 1.0000 mg | INTRAMUSCULAR | Status: DC | PRN

## 2014-06-26 MED ORDER — POLYETHYLENE GLYCOL 3350 17 G PO PACK: 17.0000 g | PACK | Freq: Every day | ORAL | Status: DC | PRN

## 2014-06-26 MED ORDER — HYDROMORPHONE HCL 2 MG/ML IJ SOLN
INTRAMUSCULAR | Status: AC
  Filled 2014-06-26: qty 1

## 2014-06-26 MED ORDER — HYDROMORPHONE HCL 1 MG/ML IJ SOLN
0.2500 mg | INTRAMUSCULAR | Status: DC | PRN
  Administered 2014-06-26 (×2): 0.5 mg via INTRAVENOUS

## 2014-06-26 MED ORDER — ROCURONIUM BROMIDE 100 MG/10ML IV SOLN
INTRAVENOUS | Status: DC | PRN
  Administered 2014-06-26: 10 mg via INTRAVENOUS
  Administered 2014-06-26: 50 mg via INTRAVENOUS

## 2014-06-26 MED ORDER — BISACODYL 10 MG RE SUPP: 10.0000 mg | Freq: Every day | RECTAL | Status: DC | PRN

## 2014-06-26 MED ORDER — PHENOL 1.4 % MT LIQD: 1.0000 | OROMUCOSAL | Status: DC | PRN

## 2014-06-26 MED ORDER — BUPIVACAINE-EPINEPHRINE (PF) 0.25% -1:200000 IJ SOLN
INTRAMUSCULAR | Status: AC
  Filled 2014-06-26: qty 30

## 2014-06-26 MED ORDER — ACETAMINOPHEN 500 MG PO TABS
1000.0000 mg | ORAL_TABLET | Freq: Four times a day (QID) | ORAL | Status: AC
  Administered 2014-06-26 – 2014-06-27 (×2): 1000 mg via ORAL
  Administered 2014-06-27: 500 mg via ORAL
  Administered 2014-06-27: 1000 mg via ORAL
  Filled 2014-06-26 (×4): qty 2

## 2014-06-26 MED ORDER — GLYCOPYRROLATE 0.2 MG/ML IJ SOLN
INTRAMUSCULAR | Status: DC | PRN
  Administered 2014-06-26: 0.4 mg via INTRAVENOUS

## 2014-06-26 MED ORDER — CHLORHEXIDINE GLUCONATE 4 % EX LIQD: 60.0000 mL | Freq: Once | CUTANEOUS | Status: DC

## 2014-06-26 MED ORDER — ROCURONIUM BROMIDE 100 MG/10ML IV SOLN
INTRAVENOUS | Status: AC
  Filled 2014-06-26: qty 1

## 2014-06-26 MED ORDER — ACETAMINOPHEN 325 MG PO TABS: 650.0000 mg | ORAL_TABLET | Freq: Four times a day (QID) | ORAL | Status: DC | PRN

## 2014-06-26 MED ORDER — KETOROLAC TROMETHAMINE 15 MG/ML IJ SOLN
INTRAMUSCULAR | Status: AC
  Filled 2014-06-26: qty 1

## 2014-06-26 MED ORDER — MIDAZOLAM HCL 2 MG/2ML IJ SOLN
INTRAMUSCULAR | Status: AC
  Filled 2014-06-26: qty 2

## 2014-06-26 MED ORDER — CEFAZOLIN SODIUM-DEXTROSE 2-3 GM-% IV SOLR
2.0000 g | Freq: Four times a day (QID) | INTRAVENOUS | Status: AC
  Administered 2014-06-26 – 2014-06-27 (×2): 2 g via INTRAVENOUS
  Filled 2014-06-26 (×2): qty 50

## 2014-06-26 MED ORDER — LIDOCAINE HCL (CARDIAC) 20 MG/ML IV SOLN
INTRAVENOUS | Status: DC | PRN
  Administered 2014-06-26: 100 mg via INTRAVENOUS

## 2014-06-26 MED ORDER — BUPIVACAINE LIPOSOME 1.3 % IJ SUSP
20.0000 mL | Freq: Once | INTRAMUSCULAR | Status: DC
  Filled 2014-06-26: qty 20

## 2014-06-26 MED ORDER — LIDOCAINE HCL (CARDIAC) 20 MG/ML IV SOLN
INTRAVENOUS | Status: AC
  Filled 2014-06-26: qty 5

## 2014-06-26 MED ORDER — ATORVASTATIN CALCIUM 80 MG PO TABS
80.0000 mg | ORAL_TABLET | Freq: Every day | ORAL | Status: DC
  Administered 2014-06-26: 80 mg via ORAL
  Filled 2014-06-26 (×2): qty 1

## 2014-06-26 MED ORDER — ONDANSETRON HCL 4 MG/2ML IJ SOLN
INTRAMUSCULAR | Status: DC | PRN
  Administered 2014-06-26: 4 mg via INTRAVENOUS

## 2014-06-26 MED ORDER — LEVOTHYROXINE SODIUM 200 MCG PO TABS
200.0000 ug | ORAL_TABLET | Freq: Every day | ORAL | Status: DC
  Administered 2014-06-27: 200 ug via ORAL
  Filled 2014-06-26 (×2): qty 1

## 2014-06-26 MED ORDER — METHOCARBAMOL 500 MG PO TABS
500.0000 mg | ORAL_TABLET | Freq: Four times a day (QID) | ORAL | Status: DC | PRN
  Administered 2014-06-26 – 2014-06-27 (×3): 500 mg via ORAL
  Filled 2014-06-26 (×3): qty 1

## 2014-06-26 MED ORDER — SODIUM CHLORIDE 0.9 % IV SOLN: INTRAVENOUS | Status: DC

## 2014-06-26 MED ORDER — CITALOPRAM HYDROBROMIDE 40 MG PO TABS
40.0000 mg | ORAL_TABLET | Freq: Every morning | ORAL | Status: DC
  Administered 2014-06-27: 40 mg via ORAL
  Filled 2014-06-26: qty 1

## 2014-06-26 MED ORDER — GLYCOPYRROLATE 0.2 MG/ML IJ SOLN
INTRAMUSCULAR | Status: AC
  Filled 2014-06-26: qty 3

## 2014-06-26 MED ORDER — DEXAMETHASONE SODIUM PHOSPHATE 10 MG/ML IJ SOLN
10.0000 mg | Freq: Every day | INTRAMUSCULAR | Status: AC
  Filled 2014-06-26: qty 1

## 2014-06-26 MED ORDER — CEFAZOLIN SODIUM-DEXTROSE 2-3 GM-% IV SOLR
2.0000 g | INTRAVENOUS | Status: AC
  Administered 2014-06-26: 2 g via INTRAVENOUS

## 2014-06-26 MED ORDER — HYDROMORPHONE HCL 1 MG/ML IJ SOLN
INTRAMUSCULAR | Status: AC
  Filled 2014-06-26: qty 1

## 2014-06-26 MED ORDER — FLEET ENEMA 7-19 GM/118ML RE ENEM: 1.0000 | ENEMA | Freq: Once | RECTAL | Status: AC | PRN

## 2014-06-26 MED ORDER — METOCLOPRAMIDE HCL 10 MG PO TABS: 5.0000 mg | ORAL_TABLET | Freq: Three times a day (TID) | ORAL | Status: DC | PRN

## 2014-06-26 MED ORDER — DEXAMETHASONE SODIUM PHOSPHATE 10 MG/ML IJ SOLN
INTRAMUSCULAR | Status: AC
  Filled 2014-06-26: qty 1

## 2014-06-26 MED ORDER — HYDROMORPHONE HCL 1 MG/ML IJ SOLN
INTRAMUSCULAR | Status: DC | PRN
  Administered 2014-06-26 (×2): 1 mg via INTRAVENOUS
  Administered 2014-06-26: 0.5 mg via INTRAVENOUS
  Administered 2014-06-26: 1 mg via INTRAVENOUS
  Administered 2014-06-26: 0.5 mg via INTRAVENOUS

## 2014-06-26 MED ORDER — RIVAROXABAN 10 MG PO TABS
10.0000 mg | ORAL_TABLET | Freq: Every day | ORAL | Status: DC
  Administered 2014-06-27: 10 mg via ORAL
  Filled 2014-06-26 (×2): qty 1

## 2014-06-26 MED ORDER — LACTATED RINGERS IV SOLN: INTRAVENOUS | Status: DC

## 2014-06-26 MED ORDER — LEVOCETIRIZINE DIHYDROCHLORIDE 5 MG PO TABS: 5.0000 mg | ORAL_TABLET | Freq: Every evening | ORAL | Status: DC

## 2014-06-26 MED ORDER — NEOSTIGMINE METHYLSULFATE 10 MG/10ML IV SOLN
INTRAVENOUS | Status: AC
  Filled 2014-06-26: qty 1

## 2014-06-26 MED ORDER — ROCURONIUM BROMIDE 100 MG/10ML IV SOLN
INTRAVENOUS | Status: AC
Start: 2014-06-26 — End: 2014-06-26
  Filled 2014-06-26: qty 1

## 2014-06-26 MED ORDER — CEFAZOLIN SODIUM-DEXTROSE 2-3 GM-% IV SOLR
INTRAVENOUS | Status: AC
  Filled 2014-06-26: qty 50

## 2014-06-26 MED ORDER — BUPIVACAINE HCL (PF) 0.25 % IJ SOLN
INTRAMUSCULAR | Status: DC | PRN
  Administered 2014-06-26: 30 mL

## 2014-06-26 MED ORDER — ONDANSETRON HCL 4 MG PO TABS: 4.0000 mg | ORAL_TABLET | Freq: Four times a day (QID) | ORAL | Status: DC | PRN

## 2014-06-26 MED ORDER — FENTANYL CITRATE 0.05 MG/ML IJ SOLN
INTRAMUSCULAR | Status: DC | PRN
  Administered 2014-06-26 (×2): 100 ug via INTRAVENOUS

## 2014-06-26 MED ORDER — TRANEXAMIC ACID 100 MG/ML IV SOLN
2000.0000 mg | Freq: Once | INTRAVENOUS | Status: DC
  Filled 2014-06-26: qty 20

## 2014-06-26 MED ORDER — LACTATED RINGERS IV SOLN
INTRAVENOUS | Status: DC | PRN
  Administered 2014-06-26 (×2): via INTRAVENOUS

## 2014-06-26 MED ORDER — LATANOPROST 0.005 % OP SOLN
1.0000 [drp] | Freq: Every day | OPHTHALMIC | Status: DC
  Filled 2014-06-26: qty 2.5

## 2014-06-26 MED ORDER — PROMETHAZINE HCL 25 MG/ML IJ SOLN: 6.2500 mg | INTRAMUSCULAR | Status: DC | PRN

## 2014-06-26 MED ORDER — METHOCARBAMOL 1000 MG/10ML IJ SOLN
500.0000 mg | Freq: Four times a day (QID) | INTRAMUSCULAR | Status: DC | PRN
  Administered 2014-06-26: 500 mg via INTRAVENOUS
  Filled 2014-06-26: qty 5

## 2014-06-26 MED ORDER — LACTATED RINGERS IV SOLN
INTRAVENOUS | Status: DC | PRN
  Administered 2014-06-26: 12:00:00 via INTRAVENOUS

## 2014-06-26 MED ORDER — LORATADINE 10 MG PO TABS
10.0000 mg | ORAL_TABLET | Freq: Every day | ORAL | Status: DC
  Filled 2014-06-26 (×2): qty 1

## 2014-06-26 MED ORDER — TRANEXAMIC ACID 100 MG/ML IV SOLN
2000.0000 mg | INTRAVENOUS | Status: DC | PRN
  Administered 2014-06-26: 2000 mg via TOPICAL

## 2014-06-26 MED ORDER — DIPHENHYDRAMINE HCL 12.5 MG/5ML PO ELIX: 12.5000 mg | ORAL_SOLUTION | ORAL | Status: DC | PRN

## 2014-06-26 MED ORDER — FENTANYL CITRATE 0.05 MG/ML IJ SOLN
INTRAMUSCULAR | Status: AC
  Filled 2014-06-26: qty 5

## 2014-06-26 MED ORDER — SODIUM CHLORIDE 0.9 % IJ SOLN
INTRAMUSCULAR | Status: DC | PRN
  Administered 2014-06-26: 30 mL via INTRAVENOUS

## 2014-06-26 MED ORDER — ACETAMINOPHEN 10 MG/ML IV SOLN
1000.0000 mg | Freq: Once | INTRAVENOUS | Status: AC
  Administered 2014-06-26: 1000 mg via INTRAVENOUS
  Filled 2014-06-26: qty 100

## 2014-06-26 MED ORDER — ONDANSETRON HCL 4 MG/2ML IJ SOLN
INTRAMUSCULAR | Status: AC
  Filled 2014-06-26: qty 2

## 2014-06-26 MED ORDER — SODIUM CHLORIDE 0.9 % IJ SOLN
INTRAMUSCULAR | Status: AC
  Filled 2014-06-26: qty 50

## 2014-06-26 SURGICAL SUPPLY — 40 items
BAG ZIPLOCK 12X15 (MISCELLANEOUS) IMPLANT
BLADE EXTENDED COATED 6.5IN (ELECTRODE) ×3 IMPLANT
BLADE SAG 18X100X1.27 (BLADE) ×3 IMPLANT
CAPT HIP PF COP ×3 IMPLANT
CLOSURE WOUND 1/2 X4 (GAUZE/BANDAGES/DRESSINGS) ×1
COVER PERINEAL POST (MISCELLANEOUS) ×3 IMPLANT
DECANTER SPIKE VIAL GLASS SM (MISCELLANEOUS) ×3 IMPLANT
DRAPE C-ARM 42X120 X-RAY (DRAPES) ×3 IMPLANT
DRAPE STERI IOBAN 125X83 (DRAPES) ×3 IMPLANT
DRAPE U-SHAPE 47X51 STRL (DRAPES) ×9 IMPLANT
DRSG ADAPTIC 3X8 NADH LF (GAUZE/BANDAGES/DRESSINGS) ×3 IMPLANT
DRSG MEPILEX BORDER 4X4 (GAUZE/BANDAGES/DRESSINGS) ×3 IMPLANT
DRSG MEPILEX BORDER 4X8 (GAUZE/BANDAGES/DRESSINGS) ×3 IMPLANT
DURAPREP 26ML APPLICATOR (WOUND CARE) ×3 IMPLANT
ELECT REM PT RETURN 9FT ADLT (ELECTROSURGICAL) ×3
ELECTRODE REM PT RTRN 9FT ADLT (ELECTROSURGICAL) ×1 IMPLANT
EVACUATOR 1/8 PVC DRAIN (DRAIN) ×3 IMPLANT
FACESHIELD WRAPAROUND (MASK) ×12 IMPLANT
GAUZE SPONGE 4X4 12PLY STRL (GAUZE/BANDAGES/DRESSINGS) IMPLANT
GLOVE BIO SURGEON STRL SZ7.5 (GLOVE) ×3 IMPLANT
GLOVE BIO SURGEON STRL SZ8 (GLOVE) ×6 IMPLANT
GLOVE BIOGEL PI IND STRL 8 (GLOVE) ×2 IMPLANT
GLOVE BIOGEL PI INDICATOR 8 (GLOVE) ×4
GOWN STRL REUS W/TWL LRG LVL3 (GOWN DISPOSABLE) ×3 IMPLANT
GOWN STRL REUS W/TWL XL LVL3 (GOWN DISPOSABLE) ×3 IMPLANT
KIT BASIN OR (CUSTOM PROCEDURE TRAY) ×3 IMPLANT
NDL SAFETY ECLIPSE 18X1.5 (NEEDLE) ×2 IMPLANT
NEEDLE HYPO 18GX1.5 SHARP (NEEDLE) ×4
PACK TOTAL JOINT (CUSTOM PROCEDURE TRAY) ×3 IMPLANT
STRIP CLOSURE SKIN 1/2X4 (GAUZE/BANDAGES/DRESSINGS) ×2 IMPLANT
SUT ETHIBOND NAB CT1 #1 30IN (SUTURE) ×3 IMPLANT
SUT MNCRL AB 4-0 PS2 18 (SUTURE) ×3 IMPLANT
SUT VIC AB 2-0 CT1 27 (SUTURE) ×4
SUT VIC AB 2-0 CT1 TAPERPNT 27 (SUTURE) ×2 IMPLANT
SUT VLOC 180 0 24IN GS25 (SUTURE) ×3 IMPLANT
SYR 20CC LL (SYRINGE) ×3 IMPLANT
SYR 50ML LL SCALE MARK (SYRINGE) ×3 IMPLANT
TOWEL OR 17X26 10 PK STRL BLUE (TOWEL DISPOSABLE) ×3 IMPLANT
TRAY FOLEY CATH 14FRSI W/METER (CATHETERS) ×3 IMPLANT
TRAY FOLEY METER SIL LF 16FR (CATHETERS) ×3 IMPLANT

## 2014-06-26 NOTE — Interval H&P Note (Signed)
History and Physical Interval Note:  06/26/2014 12:20 PM  Jimmy Kelley  has presented today for surgery, with the diagnosis of avascular necrosis of the left hip  The various methods of treatment have been discussed with the patient and family. After consideration of risks, benefits and other options for treatment, the patient has consented to  Procedure(s): LEFT TOTAL HIP ARTHROPLASTY ANTERIOR APPROACH (Left) as a surgical intervention .  The patient's history has been reviewed, patient examined, no change in status, stable for surgery.  I have reviewed the patient's chart and labs.  Questions were answered to the patient's satisfaction.     Gearlean Alf

## 2014-06-26 NOTE — Anesthesia Preprocedure Evaluation (Addendum)
Anesthesia Evaluation  Patient identified by MRN, date of birth, ID band Patient awake    Reviewed: Allergy & Precautions, H&P , NPO status , Patient's Chart, lab work & pertinent test results  History of Anesthesia Complications (+) PROLONGED EMERGENCE and history of anesthetic complications  Airway Mallampati: II TM Distance: >3 FB Neck ROM: Full    Dental no notable dental hx.    Pulmonary former smoker,  breath sounds clear to auscultation  Pulmonary exam normal       Cardiovascular hypertension, + Past MI and + Cardiac Stents + dysrhythmias Rhythm:Regular Rate:Normal  MI 09-15-09, stent to LAD.   Neuro/Psych PSYCHIATRIC DISORDERS Depression negative neurological ROS     GI/Hepatic negative GI ROS, Neg liver ROS,   Endo/Other  Hypothyroidism   Renal/GU negative Renal ROS  negative genitourinary   Musculoskeletal  (+) Arthritis -,   Abdominal (+) + obese,   Peds negative pediatric ROS (+)  Hematology negative hematology ROS (+)   Anesthesia Other Findings   Reproductive/Obstetrics negative OB ROS                         Anesthesia Physical Anesthesia Plan  ASA: III  Anesthesia Plan: General   Post-op Pain Management:    Induction: Intravenous  Airway Management Planned: Oral ETT  Additional Equipment:   Intra-op Plan:   Post-operative Plan: Extubation in OR  Informed Consent: I have reviewed the patients History and Physical, chart, labs and discussed the procedure including the risks, benefits and alternatives for the proposed anesthesia with the patient or authorized representative who has indicated his/her understanding and acceptance.   Dental advisory given  Plan Discussed with: CRNA  Anesthesia Plan Comments: (Discussed general and spinal. Last plavix 06-22-14. Plan general.)       Anesthesia Quick Evaluation

## 2014-06-26 NOTE — Transfer of Care (Signed)
Immediate Anesthesia Transfer of Care Note  Patient: Jimmy Kelley  Procedure(s) Performed: Procedure(s): LEFT TOTAL HIP ARTHROPLASTY ANTERIOR APPROACH (Left)  Patient Location: PACU  Anesthesia Type:General  Level of Consciousness: awake, alert  and oriented  Airway & Oxygen Therapy: Patient Spontanous Breathing and Patient connected to face mask oxygen  Post-op Assessment: Report given to PACU RN and Post -op Vital signs reviewed and stable  Post vital signs: Reviewed and stable  Complications: No apparent anesthesia complications

## 2014-06-26 NOTE — Op Note (Signed)
OPERATIVE REPORT  PREOPERATIVE DIAGNOSIS: Osteoarthritis of the Left hip.   POSTOPERATIVE DIAGNOSIS: Osteoarthritis of the Left  hip.   PROCEDURE: Left total hip arthroplasty, anterior approach.   SURGEON: Gaynelle Arabian, MD   ASSISTANT: Arlee Muslim, PA-C  ANESTHESIA:  General  ESTIMATED BLOOD LOSS:- 650 ml  DRAINS: Hemovac x1.   COMPLICATIONS: None   CONDITION: PACU - hemodynamically stable.   BRIEF CLINICAL NOTE: Jimmy Kelley is a 53 y.o. male who has advanced end-  stage arthritis of his Left  hip with progressively worsening pain and  dysfunction.The patient has failed nonoperative management and presents for  total hip arthroplasty.   PROCEDURE IN DETAIL: After successful administration of spinal  anesthetic, the traction boots for the [facility] bed were placed on both  feet and the patient was placed onto the [facility] bed, boots placed into the leg  holders. The Left hip was then isolated from the perineum with plastic  drapes and prepped and draped in the usual sterile fashion. ASIS and  greater trochanter were marked and a oblique incision was made, starting  at about 1 cm lateral and 2 cm distal to the ASIS and coursing towards  the anterior cortex of the femur. The skin was cut with a 10 blade  through subcutaneous tissue to the level of the fascia overlying the  tensor fascia lata muscle. The fascia was then incised in line with the  incision at the junction of the anterior third and posterior 2/3rd. The  muscle was teased off the fascia and then the interval between the TFL  and the rectus was developed. The Hohmann retractor was then placed at  the top of the femoral neck over the capsule. The vessels overlying the  capsule were cauterized and the fat on top of the capsule was removed.  A Hohmann retractor was then placed anterior underneath the rectus  femoris to give exposure to the entire anterior capsule. A T-shaped  capsulotomy was performed. The edges  were tagged and the femoral head  was identified.       Osteophytes are removed off the superior acetabulum.  The femoral neck was then cut in situ with an oscillating saw. Traction  was then applied to the left lower extremity utilizing the [facility]  traction. The femoral head was then removed. Retractors were placed  around the acetabulum and then circumferential removal of the labrum was  performed. Osteophytes were also removed. Reaming starts at 47 mm to  medialize and  Increased in 2 mm increments to 53 mm. We reamed in  approximately 40 degrees of abduction, 20 degrees anteversion. A 54 mm  pinnacle acetabular shell was then impacted in anatomic position under  fluoroscopic guidance with excellent purchase. We did not need to place  any additional dome screws. A 36 mm neutral + 4 marathon liner was then  placed into the acetabular shell.       The femoral lift was then placed along the lateral aspect of the femur  just distal to the vastus ridge. The leg was  externally rotated and capsule  was stripped off the inferior aspect of the femoral neck down to the  level of the lesser trochanter, this was done with electrocautery. The femur was lifted after this was performed. The  leg was then placed and extended in adducted position to essentially delivering the femur. We also removed the capsule superiorly and the  piriformis from the piriformis fossa  to gain excellent exposure of the  proximal femur. Rongeur was used to remove some cancellous bone to get  into the lateral portion of the proximal femur for placement of the  initial starter reamer. The starter broaches was placed  the starter broach  and was shown to go down the center of the canal. Broaching  with the  Corail system was then performed starting at size 8, coursing  Up to size 12. A size 12 had excellent torsional and rotational  and axial stability. The trial standard offset neck was then placed  with a 36 + 5 trial head.  The hip was then reduced. We confirmed that  the stem was in the canal both on AP and lateral x-rays. It also has excellent sizing. The hip was reduced with outstanding stability through full extension, full external rotation,  and then flexion in adduction internal rotation. AP pelvis was taken  and the leg lengths were measured and found to be exactly equal. Hip  was then dislocated again and the femoral head and neck removed. The  femoral broach was removed. Size 12 Corail stem with a standard offset  neck was then impacted into the femur following native anteversion. Has  excellent purchase in the canal. Excellent torsional and rotational and  axial stability. It is confirmed to be in the canal on AP and lateral  fluoroscopic views. The 36 + 5 ceramic head was placed and the hip  reduced with outstanding stability. Again AP pelvis was taken and it  confirmed that the leg lengths were equal. The wound was then copiously  irrigated with saline solution and the capsule reattached and repaired  with Ethibond suture.  20 mL of Exparel mixed with 50 mL of saline then additional 20 ml of .25% Bupivicaine injected into the capsule and into the edge of the tensor fascia lata as well as subcutaneous tissue. The fascia overlying the tensor fascia lata was  then closed with a running #1 V-Loc. Subcu was closed with interrupted  2-0 Vicryl and subcuticular running 4-0 Monocryl. Incision was cleaned  and dried. Steri-Strips and a bulky sterile dressing applied. Hemovac  drain was hooked to suction and then he was awakened and transported to  recovery in stable condition.        Please note that a surgical assistant was a medical necessity for this procedure to perform it in a safe and expeditious manner. Assistant was necessary to provide appropriate retraction of vital neurovascular structures and to prevent femoral fracture and allow for anatomic placement of the prosthesis.  Gaynelle Arabian, M.D.

## 2014-06-26 NOTE — Anesthesia Postprocedure Evaluation (Signed)
  Anesthesia Post-op Note  Patient: Jimmy Kelley  Procedure(s) Performed: Procedure(s) (LRB): LEFT TOTAL HIP ARTHROPLASTY ANTERIOR APPROACH (Left)  Patient Location: PACU  Anesthesia Type: General  Level of Consciousness: awake and alert   Airway and Oxygen Therapy: Patient Spontanous Breathing  Post-op Pain: mild  Post-op Assessment: Post-op Vital signs reviewed, Patient's Cardiovascular Status Stable, Respiratory Function Stable, Patent Airway and No signs of Nausea or vomiting  Last Vitals:  Filed Vitals:   06/26/14 1521  BP:   Pulse:   Temp: 36.9 C  Resp: 15    Post-op Vital Signs: stable   Complications: No apparent anesthesia complications

## 2014-06-27 ENCOUNTER — Encounter (HOSPITAL_COMMUNITY): Payer: Self-pay

## 2014-06-27 LAB — CBC
HCT: 32.5 % — ABNORMAL LOW (ref 39.0–52.0)
Hemoglobin: 11 g/dL — ABNORMAL LOW (ref 13.0–17.0)
MCH: 30.1 pg (ref 26.0–34.0)
MCHC: 33.8 g/dL (ref 30.0–36.0)
MCV: 89 fL (ref 78.0–100.0)
PLATELETS: 243 10*3/uL (ref 150–400)
RBC: 3.65 MIL/uL — AB (ref 4.22–5.81)
RDW: 12.9 % (ref 11.5–15.5)
WBC: 17 10*3/uL — ABNORMAL HIGH (ref 4.0–10.5)

## 2014-06-27 LAB — BASIC METABOLIC PANEL
ANION GAP: 11 (ref 5–15)
BUN: 12 mg/dL (ref 6–23)
CO2: 25 meq/L (ref 19–32)
Calcium: 8.6 mg/dL (ref 8.4–10.5)
Chloride: 100 mEq/L (ref 96–112)
Creatinine, Ser: 1.21 mg/dL (ref 0.50–1.35)
GFR calc Af Amer: 77 mL/min — ABNORMAL LOW (ref >90)
GFR, EST NON AFRICAN AMERICAN: 67 mL/min — AB (ref >90)
GLUCOSE: 174 mg/dL — AB (ref 70–99)
POTASSIUM: 4 meq/L (ref 3.7–5.3)
SODIUM: 136 meq/L — AB (ref 137–147)

## 2014-06-27 MED ORDER — RIVAROXABAN 10 MG PO TABS: 10.0000 mg | ORAL_TABLET | Freq: Every day | ORAL | Status: AC

## 2014-06-27 MED ORDER — METHOCARBAMOL 500 MG PO TABS: 500.0000 mg | ORAL_TABLET | Freq: Four times a day (QID) | ORAL | Status: AC | PRN

## 2014-06-27 MED ORDER — TRAMADOL HCL 50 MG PO TABS: 50.0000 mg | ORAL_TABLET | Freq: Four times a day (QID) | ORAL | Status: AC | PRN

## 2014-06-27 MED ORDER — OXYCODONE HCL 5 MG PO TABS: 5.0000 mg | ORAL_TABLET | ORAL | Status: AC | PRN

## 2014-06-27 NOTE — Discharge Summary (Signed)
Physician Discharge Summary   Patient ID: Jimmy Kelley MRN: [id number] DOB/AGE: [birth year] 54 y.o.  Admit date: 06/26/2014 Discharge date: 06/27/2014  Primary Diagnosis:  Osteoarthritis of the Left hip.  Admission Diagnoses:  Past Medical History  Diagnosis Date  . Hypertension   . History of kidney stones   . Cervical spinal stenosis   . Bursitis     heel  . Chronic constipation   . Hypothyroidism   . Hyperlipidemia   . Intraventricular conduction defect   . Depression   . Chronic ischemic heart disease   . Arthritis     oa  . Plantar fasciitis   . Complication of anesthesia     pt states he wakes up slowly   . Myocardial infarction     MI 09/15/2009 stent placement   . Seasonal allergies   . Brain tumor (benign)     craniotomy   Discharge Diagnoses:   Principal Problem:   OA (osteoarthritis) of hip  Estimated body mass index is 32.98 kg/(m^2) as calculated from the following:   Height as of this encounter: 6' 2"  (1.88 m).   Weight as of this encounter: 116.574 kg (257 lb).  Procedure(s) (LRB): LEFT TOTAL HIP ARTHROPLASTY ANTERIOR APPROACH (Left)   Consults: None  HPI: Jimmy Kelley is a 53 y.o. male who has advanced end-  stage arthritis of his Left hip with progressively worsening pain and  dysfunction.The patient has failed nonoperative management and presents for  total hip arthroplasty.   Laboratory Data: Admission on 06/26/2014, Discharged on 06/27/2014  Component Date Value Ref Range Status  . WBC 06/27/2014 17.0* 4.0 - 10.5 K/uL Final  . RBC 06/27/2014 3.65* 4.22 - 5.81 MIL/uL Final  . Hemoglobin 06/27/2014 11.0* 13.0 - 17.0 g/dL Final  . HCT 06/27/2014 32.5* 39.0 - 52.0 % Final  . MCV 06/27/2014 89.0  78.0 - 100.0 fL Final  . MCH 06/27/2014 30.1  26.0 - 34.0 pg Final  . MCHC 06/27/2014 33.8  30.0 - 36.0 g/dL Final  . RDW 06/27/2014 12.9  11.5 - 15.5 % Final  . Platelets 06/27/2014 243  150 - 400 K/uL Final  . Sodium 06/27/2014 136* 137 - 147  mEq/L Final  . Potassium 06/27/2014 4.0  3.7 - 5.3 mEq/L Final  . Chloride 06/27/2014 100  96 - 112 mEq/L Final  . CO2 06/27/2014 25  19 - 32 mEq/L Final  . Glucose, Bld 06/27/2014 174* 70 - 99 mg/dL Final  . BUN 06/27/2014 12  6 - 23 mg/dL Final  . Creatinine, Ser 06/27/2014 1.21  0.50 - 1.35 mg/dL Final  . Calcium 06/27/2014 8.6  8.4 - 10.5 mg/dL Final  . GFR calc non Af Amer 06/27/2014 67* >90 mL/min Final  . GFR calc Af Amer 06/27/2014 77* >90 mL/min Final   Comment: (NOTE)                          The eGFR has been calculated using the CKD EPI equation.                          This calculation has not been validated in all clinical situations.                          eGFR's persistently <90 mL/min signify possible Chronic Kidney  Disease.  Georgiann Hahn gap 06/27/2014 11  5 - 15 Final  Hospital Outpatient Visit on 06/18/2014  Component Date Value Ref Range Status  . ABO/RH(D) 06/18/2014 A POS   Final  Hospital Outpatient Visit on 06/18/2014  Component Date Value Ref Range Status  . aPTT 06/18/2014 30  24 - 37 seconds Final  . WBC 06/18/2014 10.1  4.0 - 10.5 K/uL Final  . RBC 06/18/2014 4.54  4.22 - 5.81 MIL/uL Final  . Hemoglobin 06/18/2014 14.0  13.0 - 17.0 g/dL Final  . HCT 06/18/2014 40.4  39.0 - 52.0 % Final  . MCV 06/18/2014 89.0  78.0 - 100.0 fL Final  . MCH 06/18/2014 30.8  26.0 - 34.0 pg Final  . MCHC 06/18/2014 34.7  30.0 - 36.0 g/dL Final  . RDW 06/18/2014 12.7  11.5 - 15.5 % Final  . Platelets 06/18/2014 280  150 - 400 K/uL Final  . Sodium 06/18/2014 138  137 - 147 mEq/L Final  . Potassium 06/18/2014 4.0  3.7 - 5.3 mEq/L Final  . Chloride 06/18/2014 97  96 - 112 mEq/L Final  . CO2 06/18/2014 29  19 - 32 mEq/L Final  . Glucose, Bld 06/18/2014 97  70 - 99 mg/dL Final  . BUN 06/18/2014 19  6 - 23 mg/dL Final  . Creatinine, Ser 06/18/2014 1.41* 0.50 - 1.35 mg/dL Final  . Calcium 06/18/2014 9.9  8.4 - 10.5 mg/dL Final  . Total Protein 06/18/2014  7.9  6.0 - 8.3 g/dL Final  . Albumin 06/18/2014 4.1  3.5 - 5.2 g/dL Final  . AST 06/18/2014 39* 0 - 37 U/L Final  . ALT 06/18/2014 40  0 - 53 U/L Final  . Alkaline Phosphatase 06/18/2014 75  39 - 117 U/L Final  . Total Bilirubin 06/18/2014 1.0  0.3 - 1.2 mg/dL Final  . GFR calc non Af Amer 06/18/2014 55* >90 mL/min Final  . GFR calc Af Amer 06/18/2014 64* >90 mL/min Final   Comment: (NOTE)                          The eGFR has been calculated using the CKD EPI equation.                          This calculation has not been validated in all clinical situations.                          eGFR's persistently <90 mL/min signify possible Chronic Kidney                          Disease.  . Anion gap 06/18/2014 12  5 - 15 Final  . Prothrombin Time 06/18/2014 13.4  11.6 - 15.2 seconds Final  . INR 06/18/2014 1.02  0.00 - 1.49 Final  . ABO/RH(D) 06/18/2014 A POS   Final  . Antibody Screen 06/18/2014 NEG   Final  . Sample Expiration 06/18/2014 06/29/2014   Final  . Color, Urine 06/18/2014 AMBER* YELLOW Final   BIOCHEMICALS MAY BE AFFECTED BY COLOR  . APPearance 06/18/2014 CLEAR  CLEAR Final  . Specific Gravity, Urine 06/18/2014 1.031* 1.005 - 1.030 Final  . pH 06/18/2014 5.5  5.0 - 8.0 Final  . Glucose, UA 06/18/2014 NEGATIVE  NEGATIVE mg/dL Final  . Hgb urine dipstick 06/18/2014 LARGE* NEGATIVE Final  . Bilirubin Urine 06/18/2014  SMALL* NEGATIVE Final  . Ketones, ur 06/18/2014 NEGATIVE  NEGATIVE mg/dL Final  . Protein, ur 06/18/2014 NEGATIVE  NEGATIVE mg/dL Final  . Urobilinogen, UA 06/18/2014 0.2  0.0 - 1.0 mg/dL Final  . Nitrite 06/18/2014 NEGATIVE  NEGATIVE Final  . Leukocytes, UA 06/18/2014 NEGATIVE  NEGATIVE Final  . MRSA, PCR 06/18/2014 NEGATIVE  NEGATIVE Final  . Staphylococcus aureus 06/18/2014 NEGATIVE  NEGATIVE Final   Comment:                                 The Xpert SA Assay (FDA                          approved for NASAL specimens                          in patients over  24 years of age),                          is one component of                          a comprehensive surveillance                          program.  Test performance has                          been validated by [employer] for patients greater                          than or equal to 64 year old.                          It is not intended                          to diagnose infection nor to                          guide or monitor treatment.  . Squamous Epithelial / LPF 06/18/2014 RARE  RARE Final  . WBC, UA 06/18/2014 0-2  <3 WBC/hpf Final  . RBC / HPF 06/18/2014 21-50  <3 RBC/hpf Final  . Bacteria, UA 06/18/2014 RARE  RARE Final  . Urine-Other 06/18/2014 MUCOUS PRESENT   Final     X-Rays:Dg Chest 2 View  06/18/2014   CLINICAL DATA:  Preop chest radiograph. Left total hip replacement. History of heart stent. Previous smoker.  EXAM: CHEST  2 VIEW  COMPARISON:  Chest radiograph 08/30/2011  FINDINGS: The heart size and mediastinal contours are within normal limits. Both lungs are clear. The visualized skeletal structures are unremarkable.  IMPRESSION: No active cardiopulmonary disease.   Electronically Signed   By: Curlene Dolphin M.D.   On: 06/18/2014 16:19   Dg Hip Complete Left  06/18/2014   CLINICAL DATA:  Preop left total hip arthroplasty.  EXAM: LEFT HIP - COMPLETE 2+ VIEW  COMPARISON:  None.  FINDINGS: Bilateral avascular necrosis involves the hips, left greater night. Moderate to severe bilateral hip osteoarthritis is also noted with joint space narrowing and marginal spur formation.  IMPRESSION: 1. Bilateral hip avascular necrosis and osteoarthritis.   Electronically Signed   By: Kerby Moors M.D.   On: 06/18/2014 16:21   Dg Pelvis Portable  06/26/2014   CLINICAL DATA:  Postop left anterior hip.  EXAM: PORTABLE PELVIS 1-2 VIEWS  COMPARISON:  06/18/2014  FINDINGS: Patient has undergone placement of a left total hip arthroplasty which is intact and  normally located. Surgical drain is present over the soft tissues of the left hip. There are mild degenerative changes of the right hip. Remainder the exam is unchanged.  IMPRESSION: Postsurgical changes compatible with recent left total hip arthroplasty.   Electronically Signed   By: Marin Olp M.D.   On: 06/26/2014 16:42   Dg C-arm 1-60 Min-no Report  06/26/2014   CLINICAL DATA: INTRAOP ANT HIP   C-ARM 1-60 MINUTES  Fluoroscopy was utilized by the requesting physician.  No radiographic  interpretation.     EKG:No orders found for this or any previous visit.   Hospital Course: Patient was admitted to [facility] and taken to the OR and underwent the above state procedure without complications.  Patient tolerated the procedure well and was later transferred to the recovery room and then to the orthopaedic floor for postoperative care.  They were given PO and IV analgesics for pain control following their surgery.  They were given 24 hours of postoperative antibiotics of  Anti-infectives   Start     Dose/Rate Route Frequency Ordered Stop   06/26/14 2000  ceFAZolin (ANCEF) IVPB 2 g/50 mL premix     2 g 100 mL/hr over 30 Minutes Intravenous Every 6 hours 06/26/14 1707 06/27/14 0131   06/26/14 1029  ceFAZolin (ANCEF) IVPB 2 g/50 mL premix     2 g 100 mL/hr over 30 Minutes Intravenous On call to O.R. 06/26/14 1029 06/26/14 1319     and started on DVT prophylaxis in the form of Xarelto.   PT and OT were ordered for total hip protocol.  The patient was allowed to be WBAT with therapy. Discharge planning was consulted to help with postop disposition and equipment needs.  Patient had a good night on the evening of surgery.  They started to get up OOB with therapy on day one.  Hemovac drain was pulled without difficulty. Patient was seen in rounds on day one and felt might be ready to go home that day if met all goals.  They did well and was ready to go home.  Discharge home with home  health Diet - Cardiac diet  Follow up - in 2 weeks  Activity - WBAT  Disposition - Home  Condition Upon Discharge - good D/C Meds - See DC Summary  DVT Prophylaxis - Xarelto        Discharge Instructions   Call MD / Call 911    Complete by:  As directed   If you experience chest pain or shortness of breath, CALL 911 and be transported to the hospital emergency room.  If you develope a fever above 101 F, pus (white drainage) or increased drainage or redness at the wound, or calf pain, call your surgeon's office.     Change dressing    Complete by:  As directed   You may change your dressing dressing daily with sterile 4 x 4 inch  gauze dressing and paper tape.  Do not submerge the incision under water.     Constipation Prevention    Complete by:  As directed   Drink plenty of fluids.  Prune juice may be helpful.  You may use a stool softener, such as Colace (over the counter) 100 mg twice a day.  Use MiraLax (over the counter) for constipation as needed.     Diet - low sodium heart healthy    Complete by:  As directed      Discharge instructions    Complete by:  As directed   Pick up stool softner and laxative for home. Do not submerge incision under water. May shower. Continue to use ice for pain and swelling from surgery.  Total Hip Protocol.  Take Xarelto for two and a half more weeks, then discontinue Xarelto. Once the patient has completed the Xarelto, they may resume the 81 mg Aspirin and the Plavix 75 mg daily.     Do not sit on low chairs, stoools or toilet seats, as it may be difficult to get up from low surfaces    Complete by:  As directed      Driving restrictions    Complete by:  As directed   No driving until released by the physician.     Increase activity slowly as tolerated    Complete by:  As directed      Lifting restrictions    Complete by:  As directed   No lifting until released by the physician.     Patient may shower    Complete by:  As directed    You may shower without a dressing once there is no drainage.  Do not wash over the wound.  If drainage remains, do not shower until drainage stops.     TED hose    Complete by:  As directed   Use stockings (TED hose) for 3 weeks on both leg(s).  You may remove them at night for sleeping.     Weight bearing as tolerated    Complete by:  As directed             Medication List    STOP taking these medications       aspirin EC 81 MG tablet     clopidogrel 75 MG tablet  Commonly known as:  PLAVIX     multivitamin with minerals Tabs tablet      TAKE these medications       acetaminophen 325 MG tablet  Commonly known as:  TYLENOL  Take 650 mg by mouth every 6 (six) hours as needed for moderate pain or headache.     atorvastatin 80 MG tablet  Commonly known as:  LIPITOR  Take 80 mg by mouth at bedtime.     citalopram 40 MG tablet  Commonly known as:  CELEXA  Take 40 mg by mouth every morning. Pt states takes at night     docusate sodium 100 MG capsule  Commonly known as:  COLACE  Take 200 mg by mouth daily. Takes as needed     latanoprost 0.005 % ophthalmic solution  Commonly known as:  XALATAN  Place 1 drop into both eyes at bedtime.     levocetirizine 5 MG tablet  Commonly known as:  XYZAL  Take 5 mg by mouth every evening. Has not taken in past 3 months     levothyroxine 200 MCG tablet  Commonly known as:  SYNTHROID, LEVOTHROID  Take  200 mcg by mouth daily before breakfast.     methocarbamol 500 MG tablet  Commonly known as:  ROBAXIN  Take 1 tablet (500 mg total) by mouth every 6 (six) hours as needed for muscle spasms.     oxyCODONE 5 MG immediate release tablet  Commonly known as:  Oxy IR/ROXICODONE  Take 1-2 tablets (5-10 mg total) by mouth every 3 (three) hours as needed for moderate pain, severe pain or breakthrough pain.     rivaroxaban 10 MG Tabs tablet  Commonly known as:  XARELTO  - Take 1 tablet (10 mg total) by mouth daily with breakfast. Take  Xarelto for two and a half more weeks, then discontinue Xarelto.  - Once the patient has completed the Xarelto, they may resume the 81 mg Aspirin and the Plavix 75 mg daily.     traMADol 50 MG tablet  Commonly known as:  ULTRAM  Take 1 tablet (50 mg total) by mouth 4 (four) times daily as needed (mild pain).       Follow-up Information   Follow up with Gearlean Alf, MD. Schedule an appointment as soon as possible for a visit in 2 weeks. (CALL OFFICE AT [phone] TO SET UP FOLLOW UP APPOINTMENT)    Specialty:  Orthopedic Surgery   Contact information:   [address] [place] [address] [place] [address] [phone]       Follow up with [facility]. (home health physical therapy)    Contact information:   [address]  [place] [address] [phone]       Signed: Arlee Muslim, PA-C Orthopaedic Surgery 07/10/2014, 2:11 PM

## 2014-06-27 NOTE — Discharge Instructions (Addendum)
Dr. Gaynelle Arabian Total Joint Specialist [facility] [address] [place], [place] [address] [phone]    ANTERIOR APPROACH TOTAL HIP REPLACEMENT POSTOPERATIVE DIRECTIONS   Hip Rehabilitation, Guidelines Following Surgery  The results of a hip operation are greatly improved after range of motion and muscle strengthening exercises. Follow all safety measures which are given to protect your hip. If any of these exercises cause increased pain or swelling in your joint, decrease the amount until you are comfortable again. Then slowly increase the exercises. Call your caregiver if you have problems or questions.  HOME CARE INSTRUCTIONS  Most of the following instructions are designed to prevent the dislocation of your new hip.  Remove items at home which could result in a fall. This includes throw rugs or furniture in walking pathways.  Continue medications as instructed at time of discharge.  You may have some home medications which will be placed on hold until you complete the course of blood thinner medication.  You may start showering once you are discharged home but do not submerge the incision under water. Just pat the incision dry and apply a dry gauze dressing on daily. Do not put on socks or shoes without following the instructions of your caregivers.  Sit on high chairs which makes it easier to stand.  Sit on chairs with arms. Use the chair arms to help push yourself up when arising.  Keep your leg on the side of the operation out in front of you when standing up.  Arrange for the use of a toilet seat elevator so you are not sitting low.    Walk with walker as instructed.  You may resume a sexual relationship in one month or when given the OK by your caregiver.  Use walker as long as suggested by your caregivers.  You may put full weight on your legs and walk as much as is comfortable. Avoid periods of inactivity such as sitting longer than an hour  when not asleep. This helps prevent blood clots.  You may return to work once you are cleared by [employer], production.  Do not drive a car for 6 weeks or until released by your surgeon.  Do not drive while taking narcotics.  Wear elastic stockings for three weeks following surgery during the day but you may remove then at night.  Make sure you keep all of your appointments after your operation with all of your doctors and caregivers. You should call the office at the above phone number and make an appointment for approximately two weeks after the date of your surgery. Change the dressing daily and reapply a dry dressing each time. Please pick up a stool softener and laxative for home use as long as you are requiring pain medications.  Continue to use ice on the hip for pain and swelling from surgery. You may notice swelling that will progress down to the foot and ankle.  This is normal after surgery.  Elevate the leg when you are not up walking on it.   It is important for you to complete the blood thinner medication as prescribed by your doctor.  Continue to use the breathing machine which will help keep your temperature down.  It is common for your temperature to cycle up and down following surgery, especially at night when you are not up moving around and exerting yourself.  The breathing machine keeps your lungs expanded and your temperature down.  RANGE OF MOTION AND STRENGTHENING EXERCISES  These exercises are designed to help you keep full movement of your hip joint. Follow your caregiver's or physical therapist's instructions. Perform all exercises about fifteen times, three times per day or as directed. Exercise both hips, even if you have had only one joint replacement. These exercises can be done on a training (exercise) mat, on the floor, on a table or on a bed. Use whatever works the best and is most comfortable for you. Use music or television while you are exercising so that the exercises are a  pleasant break in your day. This will make your life better with the exercises acting as a break in routine you can look forward to.  Lying on your back, slowly slide your foot toward your buttocks, raising your knee up off the floor. Then slowly slide your foot back down until your leg is straight again.  Lying on your back spread your legs as far apart as you can without causing discomfort.  Lying on your side, raise your upper leg and foot straight up from the floor as far as is comfortable. Slowly lower the leg and repeat.  Lying on your back, tighten up the muscle in the front of your thigh (quadriceps muscles). You can do this by keeping your leg straight and trying to raise your heel off the floor. This helps strengthen the largest muscle supporting your knee.  Lying on your back, tighten up the muscles of your buttocks both with the legs straight and with the knee bent at a comfortable angle while keeping your heel on the floor.   SKILLED REHAB INSTRUCTIONS: If the patient is transferred to a skilled rehab facility following release from the hospital, a list of the current medications will be sent to the facility for the patient to continue.  When discharged from the skilled rehab facility, please have the facility set up the patient's [place] prior to being released. Also, the skilled facility will be responsible for providing the patient with their medications at time of release from the facility to include their pain medication, the muscle relaxants, and their blood thinner medication. If the patient is still at the rehab facility at time of the two week follow up appointment, the skilled rehab facility will also need to assist the patient in arranging follow up appointment in our office and any transportation needs.  MAKE SURE YOU:  Understand these instructions.  Will watch your condition.  Will get help right away if you are not doing well or get worse.  Pick up  stool softner and laxative for home. Do not submerge incision under water. May shower. Continue to use ice for pain and swelling from surgery. Total Hip Protocol.  Take Xarelto for two and a half more weeks, then discontinue Xarelto. Once the patient has completed the Xarelto, they may resume the 81 mg Aspirin and the Plavix 75 mg daily.  Information on my medicine - XARELTO (Rivaroxaban)  This medication education was reviewed with me or my healthcare representative as part of my discharge preparation.  The pharmacist that spoke with me during my hospital stay was:  Julio Sicks, [facility]  Why was Xarelto prescribed for you? Xarelto was prescribed for you to reduce the risk of blood clots forming after orthopedic surgery. The medical term for these abnormal blood clots is venous thromboembolism (VTE).  What do you need to know about xarelto ? Take your Xarelto ONCE DAILY at the same time every day. You may take  it either with or without food.  If you have difficulty swallowing the tablet whole, you may crush it and mix in applesauce just prior to taking your dose.  Take Xarelto exactly as prescribed by your doctor and DO NOT stop taking Xarelto without talking to the doctor who prescribed the medication.  Stopping without other VTE prevention medication to take the place of Xarelto may increase your risk of developing a clot.  After discharge, you should have regular check-up appointments with your healthcare provider that is prescribing your Xarelto.    What do you do if you miss a dose? If you miss a dose, take it as soon as you remember on the same day then continue your regularly scheduled once daily regimen the next day. Do not take two doses of Xarelto on the same day.   Important Safety Information A possible side effect of Xarelto is bleeding. You should call your healthcare provider right away if you experience any of the following:   Bleeding from an injury or your  nose that does not stop.   Unusual colored urine (red or dark brown) or unusual colored stools (red or black).   Unusual bruising for unknown reasons.   A serious fall or if you hit your head (even if there is no bleeding).  Some medicines may interact with Xarelto and might increase your risk of bleeding while on Xarelto. To help avoid this, consult your healthcare provider or pharmacist prior to using any new prescription or non-prescription medications, including herbals, vitamins, non-steroidal anti-inflammatory drugs (NSAIDs) and supplements.  This website has more information on Xarelto: [email or web address]

## 2014-06-27 NOTE — Progress Notes (Signed)
   Subjective: 1 Day Post-Op Procedure(s) (LRB): LEFT TOTAL HIP ARTHROPLASTY ANTERIOR APPROACH (Left) Patient reports pain as mild.   Patient seen in rounds with Dr. Wynelle Link. Patient is well, but has had some minor complaints of pain in the hip, requiring pain medications Patient is doing fairly well and might be ready to go home today if meets all goals or possibly tomorrow following therapy.  Objective: Vital signs in last 24 hours: Temp:  [97.3 F (36.3 C)-98.9 F (37.2 C)] 98.5 F (36.9 C) (09/17 0607) Pulse Rate:  [63-95] 95 (09/17 0607) Resp:  [11-18] 14 (09/17 0607) BP: (108-138)/(66-88) 138/73 mmHg (09/17 0607) SpO2:  [94 %-100 %] 96 % (09/17 0607) Weight:  [116.574 kg (257 lb)] 116.574 kg (257 lb) (09/16 1641)  Intake/Output from previous day:  Intake/Output Summary (Last 24 hours) at 06/27/14 0724 Last data filed at 06/27/14 0610  Gross per 24 hour  Intake 4837.67 ml  Output   3645 ml  Net 1192.67 ml    Labs:  Recent Labs  06/27/14 0500  HGB 11.0*    Recent Labs  06/27/14 0500  WBC 17.0*  RBC 3.65*  HCT 32.5*  PLT 243    Recent Labs  06/27/14 0500  NA 136*  K 4.0  CL 100  CO2 25  BUN 12  CREATININE 1.21  GLUCOSE 174*  CALCIUM 8.6   No results found for this basename: LABPT, INR,  in the last 72 hours  EXAM: General - Patient is Alert, Appropriate and Oriented Extremity - Neurovascular intact Sensation intact distally Dorsiflexion/Plantar flexion intact Incision - clean, dry Motor Function - intact, moving foot and toes well on exam.   Assessment/Plan: 1 Day Post-Op Procedure(s) (LRB): LEFT TOTAL HIP ARTHROPLASTY ANTERIOR APPROACH (Left) Procedure(s) (LRB): LEFT TOTAL HIP ARTHROPLASTY ANTERIOR APPROACH (Left) Past Medical History  Diagnosis Date  . Hypertension   . History of kidney stones   . Cervical spinal stenosis   . Bursitis     heel  . Chronic constipation   . Hypothyroidism   . Hyperlipidemia   . Intraventricular  conduction defect   . Depression   . Chronic ischemic heart disease   . Arthritis     oa  . Plantar fasciitis   . Complication of anesthesia     pt states he wakes up slowly   . Myocardial infarction     MI 09/15/2009 stent placement   . Seasonal allergies   . Brain tumor (benign)     craniotomy   Principal Problem:   OA (osteoarthritis) of hip  Estimated body mass index is 32.98 kg/(m^2) as calculated from the following:   Height as of this encounter: 6\' 2"  (1.88 m).   Weight as of this encounter: 116.574 kg (257 lb). Advance diet Up with therapy Discharge home with home health possibly today. Diet - Cardiac diet Follow up - in 2 weeks Activity - WBAT Disposition - Home Condition Upon Discharge - Pending at this time. D/C Meds - See DC Summary DVT Prophylaxis - Xarelto  Jimmy Muslim, PA-C Orthopaedic Surgery 06/27/2014, 7:24 AM

## 2014-06-27 NOTE — Progress Notes (Signed)
Utilization review completed.  

## 2014-06-27 NOTE — Care Management Note (Signed)
    Page 1 of 1   06/27/2014     11:52:20 AM CARE MANAGEMENT NOTE 06/27/2014  Patient:  Jimmy Kelley,Jimmy Kelley   Account Number:  [id number]  Date Initiated:  06/27/2014  Documentation initiated by:  [facility]  Subjective/Objective Assessment:   adm: LEFT TOTAL HIP ARTHROPLASTY ANTERIOR APPROACH (Left)     Action/Plan:   discharge planning   Anticipated DC Date:  06/27/2014   Anticipated DC Plan:  [place]  CM consult      [facility] Choice  HOME HEALTH   Choice offered to / List presented to:  C-1 Patient        [facility] arranged  HH-2 PT      [place]   Status of service:  Completed, signed off Medicare Important Message given?   (If response is "NO", the following Medicare IM given date fields will be blank) Date Medicare IM given:   Medicare IM given by:   Date Additional Medicare IM given:   Additional Medicare IM given by:    Discharge Disposition:  [place]  Per UR Regulation:    If discussed at Long Length of Stay Meetings, dates discussed:    Comments:  06/27/14 11:45 CM met with pt in room to offer choice of home health agency.  Pt chooses [facility] to render HHPT.  No DME needed as his wife just had surgery and has a 3n1 and rolling walker, cane.  Address and contact information verified with pt.  Referral made to [facility] (on unit).  No other CM needs were communicated.  Mariane Masters, BSN, [place].

## 2014-06-27 NOTE — Progress Notes (Signed)
OT Cancellation Note  Patient Details Name: Jimmy Kelley MRN: [id number] DOB: [birth year]   Cancelled Treatment:    Reason Eval/Treat Not Completed: Other (comment).  Screened for OT.  Pt's wife recently had knee surgery but she can assist with LB dressing as needed.  Also, son will be there to assist with showering.  They have a 3:1 and it fits into tub.  Explained tub readiness and dressing sequence.  No further OT needs  Jermya Dowding 06/27/2014, 10:00 AM Lesle Chris, OTR/L [phone] 06/27/2014

## 2014-06-27 NOTE — Progress Notes (Signed)
06/27/14 1400  PT Visit Information  Last PT Received On 06/27/14  Assistance Needed +1  History of Present Illness s/p DA THA  PT Time Calculation  PT Start Time 1404  PT Stop Time 1429  PT Time Calculation (min) 25 min  Subjective Data  Patient Stated Goal home today if possible  Precautions  Precautions None  Restrictions  Other Position/Activity Restrictions WBAT  Pain Assessment  Pain Assessment 0-10  Pain Score 3  Pain Location left hip  Pain Intervention(s) Monitored during session;Limited activity within patient's tolerance;Repositioned  Cognition  Arousal/Alertness Awake/alert  Behavior During Therapy WFL for tasks assessed/performed  Overall Cognitive Status Within Functional Limits for tasks assessed  Bed Mobility  Overal bed mobility Needs Assistance  Bed Mobility Sit to Supine  Sit to supine Min guard  General bed mobility comments with LLE, pt self assists withRLE  Transfers  Overall transfer level Needs assistance  Equipment used Rolling walker (2 wheeled)  Transfers Sit to/from Stand  Sit to Stand Supervision  General transfer comment cues for hand placement and to advance LLE if incr hip pain with sitting  Ambulation/Gait  Ambulation/Gait assistance Supervision;Modified independent (Device/Increase time)  Ambulation Distance (Feet) 220 Feet  Assistive device Rolling walker (2 wheeled)  Gait Pattern/deviations Step-through pattern  General Gait Details cues for equal step length, pt with less UE work  Personal assistant  Stair Management Two rails;Forwards;Step to pattern  Number of Stairs 5  General stair comments cues for sequence  Total Joint Exercises  Hip ABduction/ADduction AAROM;Left;15 reps;Standing  Knee Flexion AROM;Strengthening;Left;15 reps;Standing  Long Arc Quad AROM;Left;20 reps;Seated  Marching in Standing AROM;Strengthening;Left;15 reps;Standing  Standing Hip Extension AROM;Strengthening;Left;15 reps;Standing   PT - End of Session  Activity Tolerance Patient tolerated treatment well  Patient left in bed;with call bell/phone within reach  Nurse Communication Mobility status  PT - Assessment/Plan  PT Plan Current plan remains appropriate  PT Frequency 7X/week  Follow Up Recommendations No PT follow up;Home health PT  PT equipment None recommended by PT  PT Goal Progression  Progress towards PT goals Progressing toward goals  Acute Rehab PT Goals  PT Goal Formulation With patient  Time For Goal Achievement 06/28/14  Potential to Achieve Goals Good  PT General Charges  $$ ACUTE PT VISIT 1 Procedure  PT Treatments  $Gait Training 23-37 mins

## 2014-06-27 NOTE — Evaluation (Signed)
Physical Therapy Evaluation Patient Details Name: Jimmy Kelley MRN: [id number] DOB: Mar 04, 1961 Today's Date: 06/27/2014   History of Present Illness  s/p DA THA  Clinical Impression  Pt will benefit from PT to address deficits below;  Will need to practice stairs in pm if pt up to it    Follow Up Recommendations No PT follow up;Home health PT (vs)    Equipment Recommendations       Recommendations for Other Services       Precautions / Restrictions Precautions Precautions: None Restrictions Other Position/Activity Restrictions: WBAT      Mobility  Bed Mobility Overal bed mobility: Needs Assistance Bed Mobility: Supine to Sit     Supine to sit: Min assist     General bed mobility comments: with LLE  Transfers Overall transfer level: Needs assistance Equipment used: Rolling walker (2 wheeled) Transfers: Sit to/from Stand Sit to Stand: Min assist         General transfer comment: cues for hand placement and overall safety  Ambulation/Gait Ambulation/Gait assistance: Min guard;Supervision Ambulation Distance (Feet): 150 Feet Assistive device: Rolling walker (2 wheeled) Gait Pattern/deviations: Step-through pattern;Step-to pattern     General Gait Details: cues for RW position, and safety  Stairs            Wheelchair Mobility    Modified Rankin (Stroke Patients Only)       Balance                                             Pertinent Vitals/Pain Pain Assessment: 0-10 Pain Score: 1  Pain Location: hip Pain Intervention(s): Premedicated before session;Ice applied    Home Living Family/patient expects to be discharged to:: Private residence Living Arrangements: Spouse/significant other   Type of Home: House Home Access: Stairs to enter   [employer] of Steps: 3 Home Layout: One level;Able to live on main level with bedroom/bathroom Home Equipment: Gilford Rile - 2 wheels;Cane - single point;Bedside commode       Prior Function Level of Independence: Independent               Hand Dominance        Extremity/Trunk Assessment   Upper Extremity Assessment: Overall WFL for tasks assessed           Lower Extremity Assessment: LLE deficits/detail   LLE Deficits / Details: grossly 3/5 hip, knee 3+/5 , ankle WFL     Communication   Communication: No difficulties  Cognition Arousal/Alertness: Awake/alert Behavior During Therapy: WFL for tasks assessed/performed Overall Cognitive Status: Within Functional Limits for tasks assessed                      General Comments      Exercises Total Joint Exercises Ankle Circles/Pumps: AROM;Both;15 reps Quad Sets: 15 reps;Both Heel Slides: AROM;AAROM;Left;15 reps Hip ABduction/ADduction: AAROM;Left;15 reps      Assessment/Plan    PT Assessment Patient needs continued PT services  PT Diagnosis Difficulty walking   PT Problem List Decreased strength;Decreased activity tolerance;Decreased balance;Decreased mobility;Decreased knowledge of use of DME;Decreased knowledge of precautions;Decreased range of motion  PT Treatment Interventions DME instruction;Gait training;Functional mobility training;Therapeutic activities;Therapeutic exercise;Patient/family education   PT Goals (Current goals can be found in the Care Plan section) Acute Rehab PT Goals Patient Stated Goal: home today if possible PT Goal Formulation: With patient Time For Goal Achievement:  06/28/14 Potential to Achieve Goals: Good    Frequency 7X/week   Barriers to discharge        Co-evaluation               End of Session Equipment Utilized During Treatment: Gait belt Activity Tolerance: Patient tolerated treatment well Patient left: with call bell/phone within reach;in chair Nurse Communication: Mobility status         Time: 4967-5916 PT Time Calculation (min): 24 min   Charges:   PT Evaluation $Initial PT Evaluation Tier I: 1  Procedure PT Treatments $Gait Training: 8-22 mins $Therapeutic Exercise: 8-22 mins   PT G Codes:          Jimmy Kelley 07/19/2014, 1:51 PM

## 2014-06-28 NOTE — Progress Notes (Signed)
Discharge summary sent to payer through [facility]  

## 2015-11-19 DIAGNOSIS — I10 Essential (primary) hypertension: Secondary | ICD-10-CM | POA: Insufficient documentation

## 2015-11-19 DIAGNOSIS — F419 Anxiety disorder, unspecified: Secondary | ICD-10-CM | POA: Insufficient documentation

## 2015-11-19 DIAGNOSIS — E039 Hypothyroidism, unspecified: Secondary | ICD-10-CM | POA: Insufficient documentation

## 2015-11-19 DIAGNOSIS — Z7982 Long term (current) use of aspirin: Secondary | ICD-10-CM | POA: Insufficient documentation

## 2015-11-19 DIAGNOSIS — I2510 Atherosclerotic heart disease of native coronary artery without angina pectoris: Secondary | ICD-10-CM | POA: Insufficient documentation

## 2015-11-19 DIAGNOSIS — C716 Malignant neoplasm of cerebellum: Secondary | ICD-10-CM | POA: Insufficient documentation

## 2015-11-19 DIAGNOSIS — F339 Major depressive disorder, recurrent, unspecified: Secondary | ICD-10-CM | POA: Insufficient documentation

## 2015-11-19 DIAGNOSIS — E782 Mixed hyperlipidemia: Secondary | ICD-10-CM | POA: Insufficient documentation

## 2015-11-19 DIAGNOSIS — H401130 Primary open-angle glaucoma, bilateral, stage unspecified: Secondary | ICD-10-CM | POA: Insufficient documentation

## 2016-06-08 IMAGING — CR DG CHEST 2V
2 series · 2 of 2 positions shown · non-contrast
Comparison: Chest radiograph 08/30/2011

CLINICAL DATA: Preop chest radiograph. Left total hip replacement.
History of heart stent. Previous smoker.

EXAM:
CHEST  2 VIEW

[w chest pa]
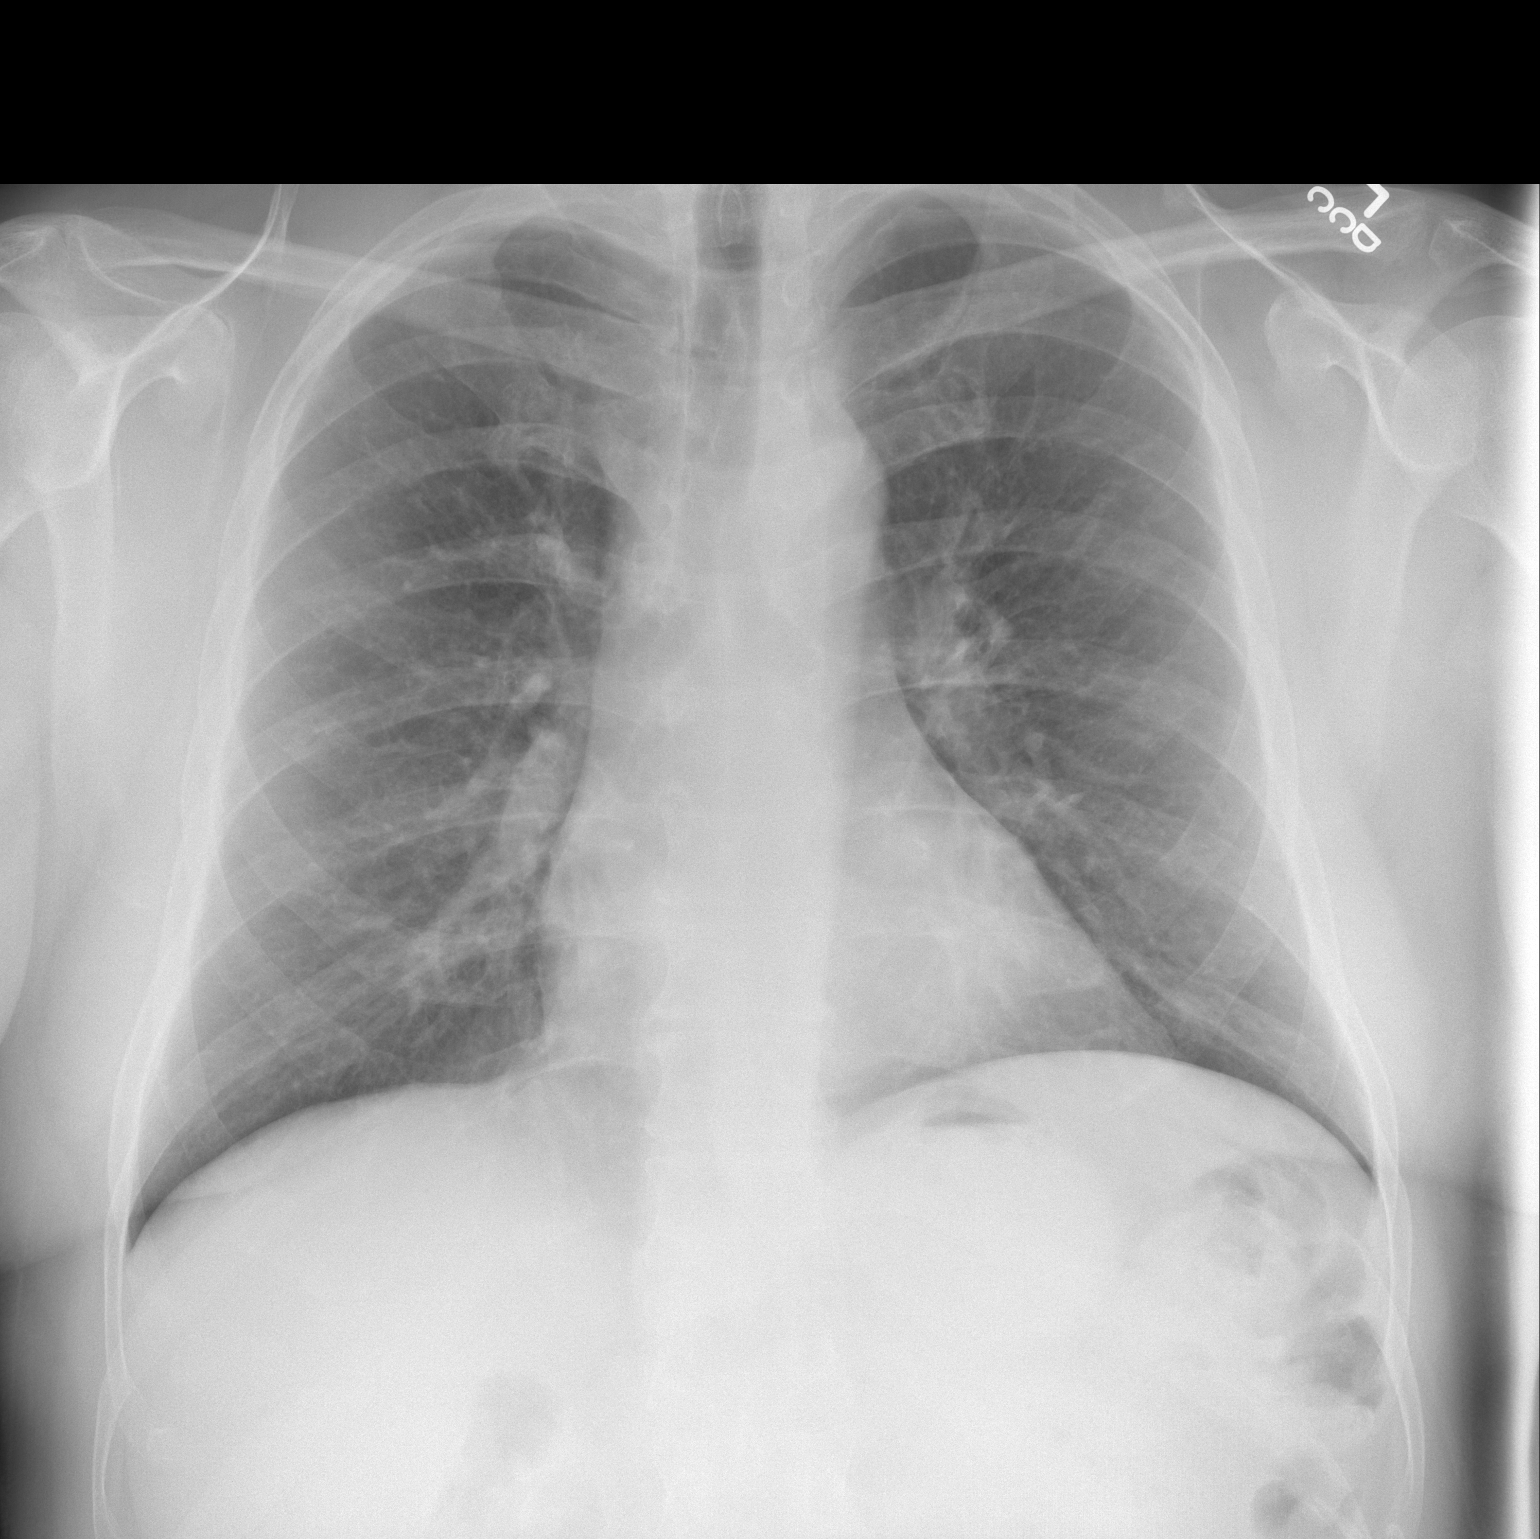

[w chest lat]
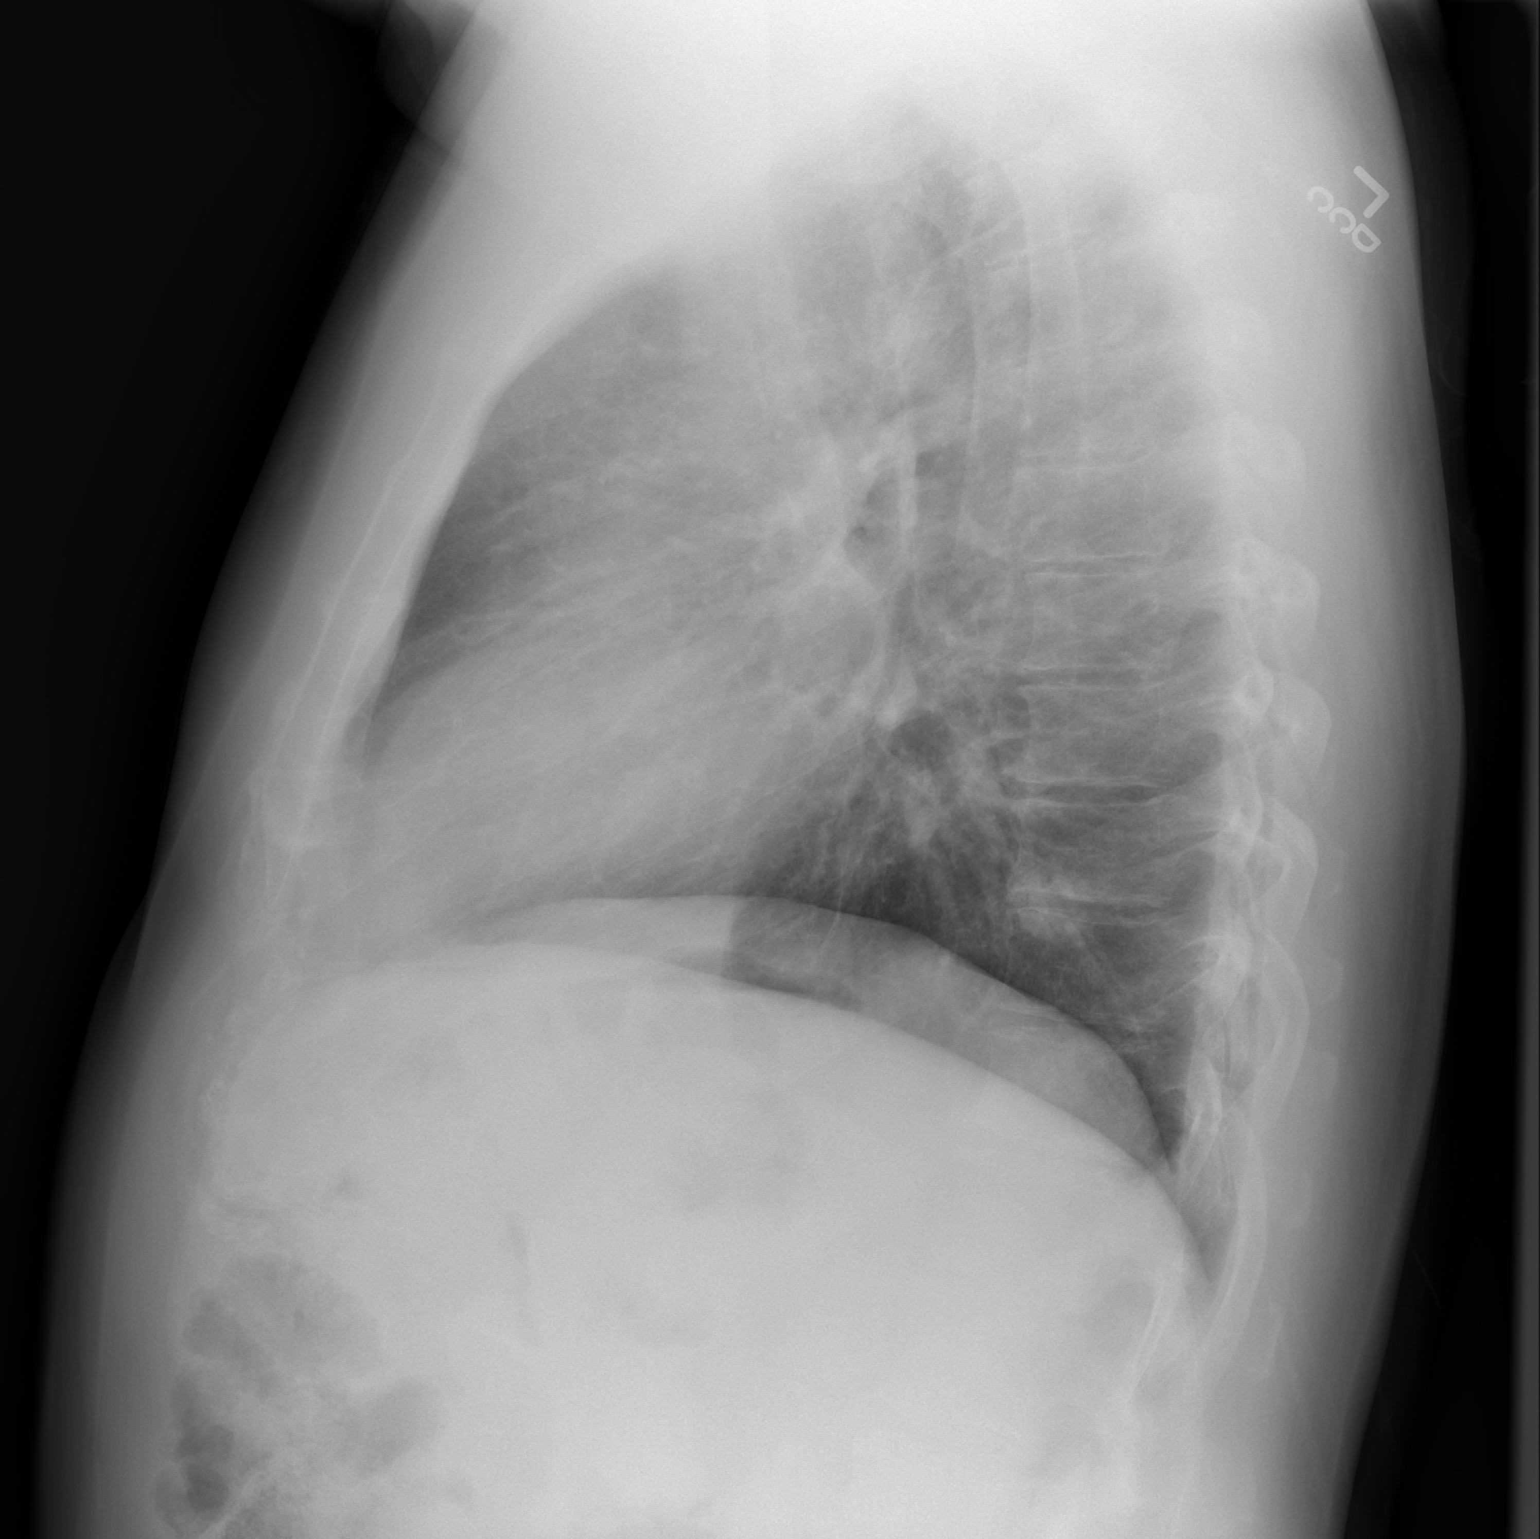

[2 of 2 positions shown; findings below may reference images not displayed]

FINDINGS: The heart size and mediastinal contours are within normal limits.
Both lungs are clear. The visualized skeletal structures are
unremarkable.
IMPRESSION: No active cardiopulmonary disease.

## 2016-12-22 DIAGNOSIS — Z0000 Encounter for general adult medical examination without abnormal findings: Secondary | ICD-10-CM | POA: Insufficient documentation

## 2019-06-25 DIAGNOSIS — M5442 Lumbago with sciatica, left side: Secondary | ICD-10-CM | POA: Insufficient documentation

## 2019-09-12 DIAGNOSIS — R002 Palpitations: Secondary | ICD-10-CM | POA: Insufficient documentation

## 2019-09-25 DIAGNOSIS — R0683 Snoring: Secondary | ICD-10-CM | POA: Insufficient documentation

## 2019-10-02 ENCOUNTER — Ambulatory Visit: Payer: PRIVATE HEALTH INSURANCE | Attending: Internal Medicine

## 2019-10-02 DIAGNOSIS — Z20822 Contact with and (suspected) exposure to covid-19: Secondary | ICD-10-CM

## 2019-10-03 LAB — NOVEL CORONAVIRUS, NAA: SARS-CoV-2, NAA: NOT DETECTED

## 2020-03-12 DIAGNOSIS — N201 Calculus of ureter: Secondary | ICD-10-CM | POA: Insufficient documentation

## 2020-10-21 DIAGNOSIS — G479 Sleep disorder, unspecified: Secondary | ICD-10-CM | POA: Insufficient documentation

## 2020-11-04 ENCOUNTER — Ambulatory Visit: Payer: BC Managed Care – PPO | Admitting: Podiatry

## 2020-11-04 ENCOUNTER — Encounter: Payer: Self-pay | Admitting: Podiatry

## 2020-11-04 ENCOUNTER — Other Ambulatory Visit: Payer: Self-pay

## 2020-11-04 ENCOUNTER — Ambulatory Visit (INDEPENDENT_AMBULATORY_CARE_PROVIDER_SITE_OTHER): Payer: BC Managed Care – PPO

## 2020-11-04 DIAGNOSIS — M722 Plantar fascial fibromatosis: Secondary | ICD-10-CM | POA: Diagnosis not present

## 2020-11-04 NOTE — Progress Notes (Signed)
Subjective:  Patient ID: Jimmy Kelley, male    DOB: 03/22/1961,  MRN: [id number]  Chief Complaint  Patient presents with  . Foot Pain    Patient presents today for left heel pain    60 y.o. male presents with the above complaint.  Patient presents with complaint of left heel pain that has been going for quite some time.  Patient states is painful to walk on it has been going for many months.  Is painful to go barefooted.  He is tried stretching and Tylenol none of which has helped.  He has had plantar fasciitis treated long time ago but this seems to have come back on the opposite foot.  He denies any other treatment options beside stretching he has not seen anyone else prior to seeing me.  He denies any other acute complaints   Review of Systems: Negative except as noted in the HPI. Denies N/V/F/Ch.  Past Medical History:  Diagnosis Date  . Arthritis    oa  . Brain tumor (benign) ([place])    craniotomy  . Bursitis    heel  . Cervical spinal stenosis   . Chronic constipation   . Chronic ischemic heart disease   . Complication of anesthesia    pt states he wakes up slowly   . Depression   . History of kidney stones   . Hyperlipidemia   . Hypertension   . Hypothyroidism   . Intraventricular conduction defect   . Myocardial infarction ([place])    MI 09/15/2009 stent placement   . Plantar fasciitis   . Seasonal allergies     Current Outpatient Medications:  .  carvedilol (COREG) 12.5 MG tablet, Take by mouth., Disp: , Rfl:  .  clopidogrel (PLAVIX) 75 MG tablet, Take 1 tablet by mouth daily., Disp: , Rfl:  .  diltiazem (MATZIM LA) 180 MG 24 hr tablet, TAKE 1 TABLET(180 MG) BY MOUTH DAILY, Disp: , Rfl:  .  nitroGLYCERIN (NITROSTAT) 0.4 MG SL tablet, Place under the tongue., Disp: , Rfl:  .  promethazine (PHENERGAN) 25 MG tablet, Take by mouth., Disp: , Rfl:  .  sertraline (ZOLOFT) 100 MG tablet, Take 1.5 tablets by mouth daily., Disp: , Rfl:  .  tamsulosin (FLOMAX) 0.4 MG CAPS  capsule, Take by mouth., Disp: , Rfl:  .  acetaminophen (TYLENOL) 325 MG tablet, Take 650 mg by mouth every 6 (six) hours as needed for moderate pain or headache., Disp: , Rfl:  .  aspirin 81 MG chewable tablet, Chew by mouth., Disp: , Rfl:  .  atorvastatin (LIPITOR) 80 MG tablet, Take 80 mg by mouth at bedtime., Disp: , Rfl:  .  citalopram (CELEXA) 40 MG tablet, Take 40 mg by mouth every morning. Pt states takes at night, Disp: , Rfl:  .  docusate sodium (COLACE) 100 MG capsule, Take 200 mg by mouth daily. Takes as needed, Disp: , Rfl:  .  latanoprost (XALATAN) 0.005 % ophthalmic solution, Place 1 drop into both eyes at bedtime., Disp: , Rfl:  .  levocetirizine (XYZAL) 5 MG tablet, Take 5 mg by mouth every evening. Has not taken in past 3 months, Disp: , Rfl:  .  levothyroxine (SYNTHROID, LEVOTHROID) 200 MCG tablet, Take 200 mcg by mouth daily before breakfast., Disp: , Rfl:  .  Melatonin 5 MG CHEW, Chew by mouth., Disp: , Rfl:  .  methocarbamol (ROBAXIN) 500 MG tablet, Take 1 tablet (500 mg total) by mouth every 6 (six) hours as needed for muscle  spasms., Disp: 80 tablet, Rfl: 0 .  oxyCODONE (OXY IR/ROXICODONE) 5 MG immediate release tablet, Take 1-2 tablets (5-10 mg total) by mouth every 3 (three) hours as needed for moderate pain, severe pain or breakthrough pain., Disp: 80 tablet, Rfl: 0 .  rivaroxaban (XARELTO) 10 MG TABS tablet, Take 1 tablet (10 mg total) by mouth daily with breakfast. Take Xarelto for two and a half more weeks, then discontinue Xarelto. Once the patient has completed the Xarelto, they may resume the 81 mg Aspirin and the Plavix 75 mg daily., Disp: 20 tablet, Rfl: 0 .  traMADol (ULTRAM) 50 MG tablet, Take 1 tablet (50 mg total) by mouth 4 (four) times daily as needed (mild pain)., Disp: 60 tablet, Rfl: 1 No current facility-administered medications for this visit.  Facility-Administered Medications Ordered in Other Visits:  .  tranexamic acid (CYKLOKAPRON) 2,000 mg in sodium  chloride 0.9 % 50 mL Topical Application, 5,035 mg, Topical, Once, Cecilio Asper, [employer], PA-C  Social History   Tobacco Use  Smoking Status Former Smoker  . Quit date: 09/15/2009  . Years since quitting: 11.1  Smokeless Tobacco Never Used    Allergies  Allergen Reactions  . Baclofen Other (See Comments)    Too sleepy Too sleepy   . Bupropion Anxiety    Shaky and nervous Shaky and nervous    Objective:  There were no vitals filed for this visit. There is no height or weight on file to calculate BMI. Constitutional Well developed. Well nourished.  Vascular Dorsalis pedis pulses palpable bilaterally. Posterior tibial pulses palpable bilaterally. Capillary refill normal to all digits.  No cyanosis or clubbing noted. Pedal hair growth normal.  Neurologic Normal speech. Oriented to person, place, and time. Epicritic sensation to light touch grossly present bilaterally.  Dermatologic Nails well groomed and normal in appearance. No open wounds. No skin lesions.  Orthopedic: Normal joint ROM without pain or crepitus bilaterally. No visible deformities. Tender to palpation at the calcaneal tuber left. No pain with calcaneal squeeze left. Ankle ROM diminished range of motion left. Silfverskiold Test: positive left.   Radiographs: Taken and reviewed. No acute fractures or dislocations. No evidence of stress fracture.  Plantar heel spur absent. Posterior heel spur present.   Assessment:   1. Plantar fasciitis of left foot    Plan:  Patient was evaluated and treated and all questions answered.  Plantar Fasciitis, left - XR reviewed as above.  - Educated on icing and stretching. Instructions given.  - Injection delivered to the plantar fascia as below. - DME: Plantar Fascial Brace - Pharmacologic management: None  Procedure: Injection Tendon/Ligament Location: Left plantar fascia at the glabrous junction; medial approach. Skin Prep: alcohol Injectate: 0.5 cc 0.5% marcaine  plain, 0.5 cc of 1% Lidocaine, 0.5 cc kenalog 10. Disposition: Patient tolerated procedure well. Injection site dressed with a band-aid.  No follow-ups on file.

## 2020-12-09 ENCOUNTER — Ambulatory Visit: Payer: BC Managed Care – PPO | Admitting: Podiatry

## 2020-12-18 ENCOUNTER — Ambulatory Visit: Payer: BC Managed Care – PPO | Admitting: Podiatry
